# Patient Record
Sex: Male | Born: 1940 | Race: White | Hispanic: No | Marital: Married | Smoking: Never smoker
Health system: Southern US, Community
[De-identification: ages and names within clinical notes are randomized; demographics above are authoritative.]

## PROBLEM LIST (undated history)

## (undated) DIAGNOSIS — T7840XA Allergy, unspecified, initial encounter: Secondary | ICD-10-CM

## (undated) DIAGNOSIS — C801 Malignant (primary) neoplasm, unspecified: Secondary | ICD-10-CM

## (undated) DIAGNOSIS — C259 Malignant neoplasm of pancreas, unspecified: Secondary | ICD-10-CM

## (undated) HISTORY — DX: Malignant (primary) neoplasm, unspecified: C80.1

## (undated) HISTORY — DX: Allergy, unspecified, initial encounter: T78.40XA

---

## 1979-10-26 HISTORY — PX: APPENDECTOMY: SHX54

## 2009-10-25 HISTORY — PX: OTHER SURGICAL HISTORY: SHX169

## 2011-06-05 ENCOUNTER — Encounter: Payer: Self-pay | Admitting: Emergency Medicine

## 2011-06-05 ENCOUNTER — Emergency Department (HOSPITAL_BASED_OUTPATIENT_CLINIC_OR_DEPARTMENT_OTHER)
Admission: EM | Admit: 2011-06-05 | Discharge: 2011-06-05 | Disposition: A | Discharge status: Home / Self Care | Payer: Medicare Other | Attending: Emergency Medicine | Admitting: Emergency Medicine

## 2011-06-05 DIAGNOSIS — S61209A Unspecified open wound of unspecified finger without damage to nail, initial encounter: Secondary | ICD-10-CM | POA: Insufficient documentation

## 2011-06-05 DIAGNOSIS — Y92009 Unspecified place in unspecified non-institutional (private) residence as the place of occurrence of the external cause: Secondary | ICD-10-CM | POA: Insufficient documentation

## 2011-06-05 DIAGNOSIS — W298XXA Contact with other powered powered hand tools and household machinery, initial encounter: Secondary | ICD-10-CM | POA: Insufficient documentation

## 2011-06-05 DIAGNOSIS — S61219A Laceration without foreign body of unspecified finger without damage to nail, initial encounter: Secondary | ICD-10-CM

## 2011-06-05 MED ORDER — LIDOCAINE HCL 2 % IJ SOLN
INTRAMUSCULAR | Status: AC
  Administered 2011-06-05: 14:00:00 via SUBCUTANEOUS
  Filled 2011-06-05: qty 1

## 2011-06-05 MED ORDER — TETANUS-DIPHTH-ACELL PERTUSSIS 5-2.5-18.5 LF-MCG/0.5 IM SUSP: 0.5000 mL | Freq: Once | INTRAMUSCULAR | Status: DC

## 2011-06-05 NOTE — ED Provider Notes (Signed)
History     CSN: [id number] Arrival date & time: 06/05/2011  1:25 PM  Chief Complaint  Patient presents with  . Ear Laceration    Laceration to L index finger with chainsaw no loss of mobility noted bleeding controlled   Patient is a 70 y.o. male presenting with hand pain.  Hand Pain This is a new problem. The current episode started today. The problem occurs constantly. The problem has been unchanged. Associated symptoms include joint swelling. Pertinent negatives include no numbness or weakness. The symptoms are aggravated by bending. He has tried nothing for the symptoms. The treatment provided no relief.   patient reports he cut his left index finger with a chain saw.  Patient complains a laceration to the base of the finger. He denies any numbness. Patient denies any weakness. Patient's last tetanus shot was 8 years ago History reviewed. No pertinent past medical history.  History reviewed. No pertinent past surgical history.  Family History  Problem Relation Age of Onset  . Diabetes Mother   . Heart failure Father     History  Substance Use Topics  . Smoking status: Never Smoker   . Smokeless tobacco: Not on file  . Alcohol Use: No      Review of Systems  Musculoskeletal: Positive for joint swelling.  Skin: Positive for wound.  Neurological: Negative for weakness and numbness.  All other systems reviewed and are negative.    Physical Exam  BP 129/78  Pulse 88  Temp(Src) 99.8 F (37.7 C) (Oral)  Resp 22  SpO2 98%  Physical Exam  Nursing note and vitals reviewed. Constitutional: He appears well-developed and well-nourished.  HENT:  Head: Normocephalic.  Eyes: Pupils are equal, round, and reactive to light.  Cardiovascular: Normal rate.   Pulmonary/Chest: Effort normal.  Musculoskeletal: Normal range of motion.       1.8 cm laceration base of left second finger on the dorsal aspect. Patient has full range of motion. Neurovascular and neurosensory are  intact. I explored the wound. There is no tendon or vascular injury  Neurological: He is alert.  Skin: Skin is warm.  Psychiatric: He has a normal mood and affect.    ED Course  LACERATION REPAIR Date/Time: 06/05/2011 2:23 PM Performed by: Langston Masker Authorized by: Kennon Rounds D Consent: Verbal consent obtained. Risks and benefits: risks, benefits and alternatives were discussed Consent given by: patient Patient understanding: patient states understanding of the procedure being performed Patient identity confirmed: verbally with patient Time out: Immediately prior to procedure a "time out" was called to verify the correct patient, procedure, equipment, support staff and site/side marked as required. Body area: upper extremity Location details: left index finger Laceration length: 1.8 cm Contamination: The wound is contaminated. Foreign bodies: no foreign bodies Tendon involvement: none Nerve involvement: none Vascular damage: no Anesthesia: local infiltration Local anesthetic: lidocaine 2% without epinephrine Anesthetic total: 3 ml Patient sedated: no Preparation: Patient was prepped and draped in the usual sterile fashion. Irrigation solution: saline Amount of cleaning: standard Debridement: none Degree of undermining: none Skin closure: 5-0 Prolene Technique: simple Approximation: loose Approximation difficulty: simple Patient tolerance: Patient tolerated the procedure well with no immediate complications.    MDMPt counseled on wound care.      Langston Masker, [place] 06/05/11 1426  Langston Masker, [place] 06/05/11 1427  [place], [place] 06/05/11 1427

## 2011-06-05 NOTE — ED Notes (Signed)
Pt was ambulatory and voiced discharge instruction understanding.

## 2011-06-05 NOTE — ED Notes (Signed)
1 Inch laceration to L index finger

## 2011-06-07 NOTE — ED Provider Notes (Signed)
Evaluation and management procedures were performed by the PA/NP under my supervision/collaboration.    Felisa Bonier, MD 06/07/11 [phone]

## 2011-06-13 ENCOUNTER — Inpatient Hospital Stay (INDEPENDENT_AMBULATORY_CARE_PROVIDER_SITE_OTHER)
Admission: RE | Admit: 2011-06-13 | Discharge: 2011-06-13 | Disposition: A | Discharge status: Home / Self Care | Payer: Medicare Other | Source: Ambulatory Visit | Attending: Family Medicine | Admitting: Family Medicine

## 2011-06-13 ENCOUNTER — Encounter: Payer: Self-pay | Admitting: Family Medicine

## 2011-06-13 DIAGNOSIS — S61409A Unspecified open wound of unspecified hand, initial encounter: Secondary | ICD-10-CM

## 2011-06-13 DIAGNOSIS — Z23 Encounter for immunization: Secondary | ICD-10-CM

## 2011-09-11 ENCOUNTER — Emergency Department (INDEPENDENT_AMBULATORY_CARE_PROVIDER_SITE_OTHER)
Admission: EM | Admit: 2011-09-11 | Discharge: 2011-09-11 | Disposition: A | Discharge status: Home / Self Care | Payer: Medicare Other | Source: Home / Self Care | Attending: Emergency Medicine | Admitting: Emergency Medicine

## 2011-09-11 DIAGNOSIS — R05 Cough: Secondary | ICD-10-CM

## 2011-09-11 DIAGNOSIS — J069 Acute upper respiratory infection, unspecified: Secondary | ICD-10-CM

## 2011-09-11 MED ORDER — HYDROCODONE-HOMATROPINE 5-1.5 MG/5ML PO SYRP: 5.0000 mL | ORAL_SOLUTION | Freq: Four times a day (QID) | ORAL | Status: AC | PRN

## 2011-09-11 MED ORDER — AZITHROMYCIN 250 MG PO TABS: ORAL_TABLET | ORAL | Status: AC

## 2011-09-11 NOTE — ED Provider Notes (Signed)
History     CSN: [id number] Arrival date & time: 09/11/2011 10:59 AM   First MD Initiated Contact with Patient 09/11/11 1015      Chief Complaint  Patient presents with  . URI    (Consider location/radiation/quality/duration/timing/severity/associated sxs/prior treatment) HPI Jonathan Dawson is a 70 y.o. male who complains of onset of cold symptoms for 7 days.  he is using Mucinex-D and nebulizer which helps a little bit.  +sick contacts at home.  Not able to rest much recently + sore throat + cough No pleuritic pain No wheezing + nasal congestion + post-nasal drainage No sinus pain/pressure No chest congestion No itchy/red eyes No earache No hemoptysis No SOB No chills/sweats No fever No nausea No vomiting No abdominal pain No diarrhea No skin rashes No fatigue No myalgias No headache     Past Medical History  Diagnosis Date  . Asthma     Past Surgical History  Procedure Date  . Appendectomy     Family History  Problem Relation Age of Onset  . Diabetes Mother   . Heart failure Father     History  Substance Use Topics  . Smoking status: Current Everyday Smoker  . Smokeless tobacco: Not on file  . Alcohol Use: No      Review of Systems  Allergies  Review of patient's allergies indicates no known allergies.  Home Medications   Current Outpatient Rx  Name Route Sig Dispense Refill  . AZITHROMYCIN 250 MG PO TABS  Use as directed 1 each 0  . HYDROCODONE-HOMATROPINE 5-1.5 MG/5ML PO SYRP Oral Take 5 mLs by mouth every 6 (six) hours as needed for cough. 120 mL 0  . VARDENAFIL HCL 10 MG PO TABS Oral Take 10 mg by mouth daily as needed.        BP 144/85  Pulse 24  Temp(Src) 97.8 F (36.6 C) (Oral)  Resp 24  Ht 5\' 8"  (1.727 m)  Wt 192 lb 8 oz (87.317 kg)  BMI 29.27 kg/m2  SpO2 95%  Physical Exam  Nursing note and vitals reviewed. Constitutional: He is oriented to person, place, and time. He appears well-developed and well-nourished.  HENT:    Head: Normocephalic and atraumatic.  Right Ear: Tympanic membrane, external ear and ear canal normal.  Left Ear: Tympanic membrane, external ear and ear canal normal.  Nose: Mucosal edema and rhinorrhea present.  Mouth/Throat: Posterior oropharyngeal erythema present. No oropharyngeal exudate or posterior oropharyngeal edema.  Neck: Neck supple.  Cardiovascular: Regular rhythm and normal heart sounds.   Pulmonary/Chest: Effort normal and breath sounds normal. No respiratory distress.  Neurological: He is alert and oriented to person, place, and time.  Skin: Skin is warm and dry.  Psychiatric: He has a normal mood and affect. His speech is normal.    ED Course  Procedures (including critical care time)  Labs Reviewed - No data to display No results found.   1. Cough   2. Acute upper respiratory infections of unspecified site       MDM   1)  Take the prescribed antibiotic as instructed. 2)  Use nasal saline solution (over the counter) at least 3 times a day. 3)  Use over the counter decongestants like Mucinex-D every 12 hours as needed to help with congestion.  If you have hypertension, do not take medicines with sudafed.  4)  Can take tylenol every 6 hours or motrin every 8 hours for pain or fever. 5)  Follow up with your primary doctor if  no improvement in 5-7 days, sooner if increasing pain, fever, or new symptoms.       Lily Kocher, MD 09/11/11 [phone]

## 2011-09-27 NOTE — Progress Notes (Signed)
Summary: suture removal/TM   Vital Signs:  Patient Profile:   70 Years Old Male CC:      suture removal left 1st knuckle (day #8) Height:     68 inches Weight:      192 pounds O2 Sat:      94 % O2 treatment:    Room Air Temp:     98.2 degrees F oral Pulse rate:   75 / minute Resp:     16 per minute BP sitting:   147 / 89  (left arm) Cuff size:   regular  Pt. in pain?   no  Vitals Entered By: Lavell Islam RN (June 13, 2011 1:15 PM)                   Prior Medication List:  No prior medications documented  Updated Prior Medication List: No Medications Current Allergies: No known allergies History of Present Illness Chief Complaint: suture removal left 1st knuckle (day #8) History of Present Illness: 70 yo M here for suture removal.  Was using a chainsaw when it slipped and cut him on skin overlying radial aspect of 2nd MCP - no tendon damage or bony damage but required 4 sutures.  Reports he did not get a tetanus shot though has been > 7 years since last one.  Not on abx.  No fever, drainage, other complaints.  REVIEW OF SYSTEMS Constitutional Symptoms      Denies fever, chills, night sweats, weight loss, weight gain, and fatigue.  Eyes       Denies change in vision, eye pain, eye discharge, glasses, contact lenses, and eye surgery. Ear/Nose/Throat/Mouth       Denies hearing loss/aids, change in hearing, ear pain, ear discharge, dizziness, frequent runny nose, frequent nose bleeds, sinus problems, sore throat, hoarseness, and tooth pain or bleeding.  Respiratory       Denies dry cough, productive cough, wheezing, shortness of breath, asthma, bronchitis, and emphysema/COPD.  Cardiovascular       Denies murmurs, chest pain, and tires easily with exhertion.    Gastrointestinal       Denies stomach pain, nausea/vomiting, diarrhea, constipation, blood in bowel movements, and indigestion. Genitourniary       Denies painful urination, kidney stones, and loss of urinary  control. Neurological       Denies paralysis, seizures, and fainting/blackouts. Musculoskeletal       Denies muscle pain, joint pain, joint stiffness, decreased range of motion, redness, swelling, muscle weakness, and gout.  Skin       Denies bruising, unusual mles/lumps or sores, and hair/skin or nail changes.  Psych       Denies mood changes, temper/anger issues, anxiety/stress, speech problems, depression, and sleep problems. Other Comments: suture removal left 1st knuckle   Past History:  Family History: Last updated: 06/13/2011 Family History of CAD Male 1st degree relative <50 Family History Diabetes 1st degree relative  Social History: Last updated: 06/13/2011 married Never Smoked Alcohol use-yes Drug use-no  Past Medical History: Unremarkable  Past Surgical History: Appendectomy  Family History: Family History of CAD Male 1st degree relative <50 Family History Diabetes 1st degree relative  Social History: married Never Smoked Alcohol use-yes Drug use-no Smoking Status:  never Drug Use:  no Physical Exam General appearance: well developed, well nourished, no acute distress L hand - well healing wound to radial aspect overlying 2nd MCP joint.  No erythema, warmth, discharge.  4 sutures removed and 4 steri-strips placed.  DTaP given  today as well. Assessment New Problems: LACERATION, HAND, LEFT (ICD-882.0) FAMILY HISTORY DIABETES 1ST DEGREE RELATIVE (ICD-V18.0) FAMILY HISTORY OF CAD MALE 1ST DEGREE RELATIVE <50 (ICD-V17.3)   Plan New Orders: Tdap => 67yrs IM [90715] Admin 1st Vaccine [90471] No Charge Patient Arrived (NCPA0) [NCPA0] Planning Comments:   Sutures removed, steri-strips placed and discussed routine wound care.  DTaP given as well.  F/u as needed.   The patient and/or caregiver has been counseled thoroughly with regard to medications prescribed including dosage, schedule, interactions, rationale for use, and possible side effects and they  verbalize understanding.  Diagnoses and expected course of recovery discussed and will return if not improved as expected or if the condition worsens. Patient and/or caregiver verbalized understanding.   Orders Added: 1)  Tdap => 37yrs IM [90715] 2)  Admin 1st Vaccine [90471] 3)  No Charge Patient Arrived (NCPA0) [NCPA0]   Immunizations Administered:  Tetanus Vaccine:    Vaccine Type: Tdap    Site: left deltoid    Mfr: boostrix    Dose: 0.5 ml    Route: IM    Given by: Lavell Islam RN    Exp. Date: 09/17/2012    Lot #: [id number]    VIS given: 09/11/08 version given June 13, 2011.   Immunizations Administered:  Tetanus Vaccine:    Vaccine Type: Tdap    Site: left deltoid    Mfr: boostrix    Dose: 0.5 ml    Route: IM    Given by: Lavell Islam RN    Exp. Date: 09/17/2012    Lot #: [id number]    VIS given: 09/11/08 version given June 13, 2011.

## 2012-09-03 ENCOUNTER — Encounter: Payer: Self-pay | Admitting: Emergency Medicine

## 2012-09-03 ENCOUNTER — Emergency Department (INDEPENDENT_AMBULATORY_CARE_PROVIDER_SITE_OTHER)
Admission: EM | Admit: 2012-09-03 | Discharge: 2012-09-03 | Disposition: A | Discharge status: Home / Self Care | Payer: Medicare Other | Source: Home / Self Care | Attending: Emergency Medicine | Admitting: Emergency Medicine

## 2012-09-03 DIAGNOSIS — J069 Acute upper respiratory infection, unspecified: Secondary | ICD-10-CM | POA: Diagnosis not present

## 2012-09-03 DIAGNOSIS — J029 Acute pharyngitis, unspecified: Secondary | ICD-10-CM

## 2012-09-03 MED ORDER — GUAIFENESIN-CODEINE 100-10 MG/5ML PO SYRP: 5.0000 mL | ORAL_SOLUTION | Freq: Four times a day (QID) | ORAL | Status: DC | PRN

## 2012-09-03 MED ORDER — FLUTICASONE PROPIONATE 50 MCG/ACT NA SUSP: 2.0000 | Freq: Every day | NASAL | Status: DC

## 2012-09-03 MED ORDER — AZITHROMYCIN 250 MG PO TABS: ORAL_TABLET | ORAL | Status: DC

## 2012-09-03 NOTE — ED Notes (Signed)
Patient reports congestion, cough, body aches and fatigue with no documented fever x 6 days. May have taken OTC this a.m. (cannot remember). Has not had the Flu vaccination this season.

## 2012-09-03 NOTE — ED Provider Notes (Signed)
History     CSN: [id number]  Arrival date & time 09/03/12  1112   First MD Initiated Contact with Patient 09/03/12 1116      Chief Complaint  Patient presents with  . Nasal Congestion  . Generalized Body Aches  . Cough    (Consider location/radiation/quality/duration/timing/severity/associated sxs/prior treatment) HPI Jonathan Dawson is a 71 y.o. male who complains of onset of cold symptoms for 7 days.  The symptoms are constant and mild-moderate in severity.  He was here last year at the same time and admits to raking leaves last week. + sore throat + cough No pleuritic pain No wheezing + nasal congestion + post-nasal drainage + sinus pain/pressure No chest congestion No itchy/red eyes + earache (congestion) No hemoptysis No SOB + chills No fever No nausea No vomiting No abdominal pain No diarrhea No skin rashes No fatigue No myalgias No headache    Past Medical History  Diagnosis Date  . Asthma     Past Surgical History  Procedure Date  . Appendectomy     Family History  Problem Relation Age of Onset  . Diabetes Mother   . Heart failure Father     History  Substance Use Topics  . Smoking status: Former Games developer  . Smokeless tobacco: Not on file  . Alcohol Use: No      Review of Systems  All other systems reviewed and are negative.    Allergies  Review of patient's allergies indicates no known allergies.  Home Medications   Current Outpatient Rx  Name  Route  Sig  Dispense  Refill  . AZITHROMYCIN 250 MG PO TABS      Use as directed   1 each   0   . FLUTICASONE PROPIONATE 50 MCG/ACT NA SUSP   Nasal   Place 2 sprays into the nose daily.   16 g   2   . GUAIFENESIN-CODEINE 100-10 MG/5ML PO SYRP   Oral   Take 5 mLs by mouth 4 (four) times daily as needed for cough or congestion.   120 mL   0   . VARDENAFIL HCL 10 MG PO TABS   Oral   Take 10 mg by mouth daily as needed.             BP 114/85  Pulse 99  Temp 98.1 F (36.7 C)  (Oral)  Resp 16  Ht 5\' 8"  (1.727 m)  Wt 190 lb (86.183 kg)  BMI 28.89 kg/m2  SpO2 95%  Physical Exam  Nursing note and vitals reviewed. Constitutional: He is oriented to person, place, and time. He appears well-developed and well-nourished.  HENT:  Head: Normocephalic and atraumatic.  Right Ear: Tympanic membrane, external ear and ear canal normal.  Left Ear: Tympanic membrane, external ear and ear canal normal.  Nose: Mucosal edema and rhinorrhea present.  Mouth/Throat: Posterior oropharyngeal erythema present. No oropharyngeal exudate or posterior oropharyngeal edema.  Eyes: No scleral icterus.  Neck: Neck supple.  Cardiovascular: Regular rhythm and normal heart sounds.   Pulmonary/Chest: Effort normal and breath sounds normal. No respiratory distress.  Neurological: He is alert and oriented to person, place, and time.  Skin: Skin is warm and dry.  Psychiatric: He has a normal mood and affect. His speech is normal.    ED Course  Procedures (including critical care time)  Labs Reviewed - No data to display No results found.   1. Acute upper respiratory infections of unspecified site   2. Acute pharyngitis  MDM  1)  Take the prescribed antibiotic as instructed.  Could be viral/allergic and have also Rx Flonase which has helped him in the past. 2)  Use nasal saline solution (over the counter) at least 3 times a day. 3)  Use over the counter decongestants like Zyrtec-D every 12 hours as needed to help with congestion.  If you have hypertension, do not take medicines with sudafed.  4)  Can take tylenol every 6 hours or motrin every 8 hours for pain or fever. 5)  Follow up with your primary doctor if no improvement in 5-7 days, sooner if increasing pain, fever, or new symptoms.     Marlaine Hind, MD 09/03/12 1145

## 2012-09-05 ENCOUNTER — Telehealth: Payer: Self-pay | Admitting: *Deleted

## 2012-12-20 ENCOUNTER — Encounter: Payer: Self-pay | Admitting: Family Medicine

## 2012-12-20 ENCOUNTER — Ambulatory Visit (INDEPENDENT_AMBULATORY_CARE_PROVIDER_SITE_OTHER): Payer: Medicare Other | Admitting: Family Medicine

## 2012-12-20 VITALS — BP 137/88 | HR 81 | Temp 97.7°F | Ht 68.5 in | Wt 194.0 lb

## 2012-12-20 DIAGNOSIS — A499 Bacterial infection, unspecified: Secondary | ICD-10-CM

## 2012-12-20 DIAGNOSIS — L309 Dermatitis, unspecified: Secondary | ICD-10-CM | POA: Insufficient documentation

## 2012-12-20 DIAGNOSIS — I4510 Unspecified right bundle-branch block: Secondary | ICD-10-CM

## 2012-12-20 DIAGNOSIS — J329 Chronic sinusitis, unspecified: Secondary | ICD-10-CM | POA: Diagnosis not present

## 2012-12-20 DIAGNOSIS — Z87898 Personal history of other specified conditions: Secondary | ICD-10-CM

## 2012-12-20 DIAGNOSIS — N529 Male erectile dysfunction, unspecified: Secondary | ICD-10-CM | POA: Diagnosis not present

## 2012-12-20 DIAGNOSIS — L259 Unspecified contact dermatitis, unspecified cause: Secondary | ICD-10-CM | POA: Diagnosis not present

## 2012-12-20 DIAGNOSIS — J309 Allergic rhinitis, unspecified: Secondary | ICD-10-CM

## 2012-12-20 DIAGNOSIS — J302 Other seasonal allergic rhinitis: Secondary | ICD-10-CM | POA: Insufficient documentation

## 2012-12-20 DIAGNOSIS — B9689 Other specified bacterial agents as the cause of diseases classified elsewhere: Secondary | ICD-10-CM

## 2012-12-20 MED ORDER — DOXYCYCLINE HYCLATE 100 MG PO TABS: ORAL_TABLET | ORAL | Status: AC

## 2012-12-20 MED ORDER — TADALAFIL 20 MG PO TABS: 20.0000 mg | ORAL_TABLET | Freq: Every day | ORAL | Status: DC | PRN

## 2012-12-20 MED ORDER — TRIAMCINOLONE ACETONIDE 0.1 % EX CREA: TOPICAL_CREAM | Freq: Two times a day (BID) | CUTANEOUS | Status: DC

## 2012-12-20 NOTE — Progress Notes (Signed)
CC: Jonathan Dawson is a 72 y.o. male is here for Establish Care and Sinusitis   Subjective: HPI:  Patient presents to establish care, very pleasant 72 year old who also gets care at the [place].  Acute complaint of worsening seasonal allergies. He is noticed to 2 weeks of persistent worsening nasal congestion, darkening in color without blood. Associated with daily facial pressure in the left forehead worse with leaning forward. Has been associated with worsening productive cough. Symptoms are described as moderate in severity and worsening a daily basis. Interventions including daily fluticasone nasal spray and daily Zyrtec, nothing seems to make it better or worse. Symptoms are present during all waking hours and do not interfere with sleep. He complains of persistent clear nasal discharge mild severity throughout the winter for the past many years typically greatly improved with the above regimen.   Patient complains of erectile dysfunction has been present for years. He has tried Orthoptist and Viagra both of which caused headaches. He would like to try Cialis. He describes himself as someone with a grade libido but trouble maintaining an erection, no trouble initiating an erection. Denies dysuria, urinary hesitancy/retention, nor saddle pain. He denies exertional chest pain or limb claudication  Patient complains of a rash on his left shin that has been present for 2-3 months. It seems to be getting worse on a monthly basis. He denies rashes elsewhere. Interventions have included moisturizing cream and without much improvement. He has never had this before. Nothing particularly makes it better or worse.  He has a history of elevated PSA. Over the past 2 years it has fluctuated between 3.5 and 4.5. Most recent PSA spring of last year was below 4.0. He is currently seeing a urologist. He asks today my opinion whether or not he should get a biopsy. No family history or personal history of prostate cancer. He  has no genitourinary complaints other than trouble with erections.    Review of Systems - General ROS: negative for - chills, fever, night sweats, weight gain or weight loss Ophthalmic ROS: negative for - decreased vision Psychological ROS: negative for - anxiety or depression ENT ROS: negative for - hearing change, tinnitus Hematological and Lymphatic ROS: negative for - bleeding problems, bruising or swollen lymph nodes Breast ROS: negative Respiratory ROS: shortness of breath, or wheezing Cardiovascular ROS: no chest pain or dyspnea on exertion Gastrointestinal ROS: no abdominal pain, change in bowel habits, or black or bloody stools Genito-Urinary ROS: negative for - genital discharge, genital ulcers, incontinence or abnormal bleeding from genitals Musculoskeletal ROS: negative for - joint pain or muscle pain Neurological ROS: negative for - headaches or memory loss Dermatological ROS: negative for lumps, mole changes, rash and skin lesion changes other than that described above  Past Medical History  Diagnosis Date  . Asthma      Family History  Problem Relation Age of Onset  . Diabetes Mother   . Heart failure Father   . Stroke Father   . Alcoholism Father      History  Substance Use Topics  . Smoking status: Never Smoker   . Smokeless tobacco: Not on file  . Alcohol Use: No     Objective: Filed Vitals:   12/20/12 1043  BP: 137/88  Pulse: 81  Temp: 97.7 F (36.5 C)    General: Alert and Oriented, No Acute Distress HEENT: Pupils equal, round, reactive to light. Conjunctivae clear.  External ears unremarkable, canals clear with intact TMs with appropriate landmarks.  Middle ear  appears open without effusion. Pink inferior turbinates.  Moist mucous membranes, pharynx without inflammation nor lesions.  Neck supple without palpable lymphadenopathy nor abnormal masses. Left frontal sinus tenderness to percussion Lungs: Clear to auscultation bilaterally, no  wheezing/ronchi/rales.  Comfortable work of breathing. Good air movement. Cardiac: Regular rate and rhythm. Normal S1/S2.  No murmurs, rubs, nor gallops.   Abdomen: Normal bowel sounds, soft and non tender without palpable masses. Extremities: No peripheral edema.  Strong peripheral pulses.  Mental Status: No depression, anxiety, nor agitation. Skin: Warm and dry. Mild erythema  in patches with mild hyperkeratosis on the left shin.  Assessment & Plan: Jonathan Dawson was seen today for establish care and sinusitis.  Diagnoses and associated orders for this visit:  Eczema - triamcinolone cream (KENALOG) 0.1 %; Apply topically 2 (two) times daily.  Bacterial sinusitis - doxycycline (VIBRA-TABS) 100 MG tablet; One by mouth twice a day for ten days.  Erectile dysfunction - tadalafil (CIALIS) 20 MG tablet; Take 1 tablet (20 mg total) by mouth daily as needed for erectile dysfunction. Take 30-45 minutes prior to activity.  Right bundle branch block  Seasonal allergies  Other Orders - Albuterol Sulfate (VENTOLIN HFA IN); Inhale into the lungs.    Bacterial sinusitis: Due to penicillin allergy start doxycycline Eczema: Discussed diagnosis with patient encouraged to apply topical steroid to the left shin daily for the next 2 , return if no improvement or worsening Erectile dysfunction: Provided with sample prescription for Cialis History of elevated PSA: Discussed current guidelines suggesting PSA may be a poor screening tool for prostate cancer and that biopsies may cause more harm than good. He would like to follow PSAs annually and will continue this discussion with his urologist Seasonal allergies: Worsened do to bacterial infection, Continue Zyrtec and fluticasone, consider adding nasal saline washes to daily regimen EKG scanned into EMR showing right bundle branch block  Return in about 3 months (around 03/19/2013).

## 2012-12-26 ENCOUNTER — Encounter: Payer: Self-pay | Admitting: *Deleted

## 2013-03-30 DIAGNOSIS — R21 Rash and other nonspecific skin eruption: Secondary | ICD-10-CM | POA: Diagnosis not present

## 2013-03-30 DIAGNOSIS — D485 Neoplasm of uncertain behavior of skin: Secondary | ICD-10-CM | POA: Diagnosis not present

## 2013-03-30 DIAGNOSIS — B351 Tinea unguium: Secondary | ICD-10-CM | POA: Diagnosis not present

## 2013-03-30 DIAGNOSIS — C44721 Squamous cell carcinoma of skin of unspecified lower limb, including hip: Secondary | ICD-10-CM | POA: Diagnosis not present

## 2013-04-11 DIAGNOSIS — F429 Obsessive-compulsive disorder, unspecified: Secondary | ICD-10-CM | POA: Diagnosis not present

## 2013-04-16 DIAGNOSIS — C44721 Squamous cell carcinoma of skin of unspecified lower limb, including hip: Secondary | ICD-10-CM | POA: Diagnosis not present

## 2013-04-16 DIAGNOSIS — C44621 Squamous cell carcinoma of skin of unspecified upper limb, including shoulder: Secondary | ICD-10-CM | POA: Diagnosis not present

## 2013-05-07 ENCOUNTER — Encounter: Payer: Self-pay | Admitting: Sports Medicine

## 2013-05-07 ENCOUNTER — Ambulatory Visit (INDEPENDENT_AMBULATORY_CARE_PROVIDER_SITE_OTHER): Payer: Medicare Other

## 2013-05-07 ENCOUNTER — Ambulatory Visit (INDEPENDENT_AMBULATORY_CARE_PROVIDER_SITE_OTHER): Payer: Medicare Other | Admitting: Sports Medicine

## 2013-05-07 VITALS — BP 144/83 | HR 69 | Wt 192.0 lb

## 2013-05-07 DIAGNOSIS — S63509A Unspecified sprain of unspecified wrist, initial encounter: Secondary | ICD-10-CM

## 2013-05-07 DIAGNOSIS — S63502A Unspecified sprain of left wrist, initial encounter: Secondary | ICD-10-CM | POA: Insufficient documentation

## 2013-05-07 DIAGNOSIS — M5412 Radiculopathy, cervical region: Secondary | ICD-10-CM | POA: Insufficient documentation

## 2013-05-07 DIAGNOSIS — S59919A Unspecified injury of unspecified forearm, initial encounter: Secondary | ICD-10-CM

## 2013-05-07 DIAGNOSIS — S59909A Unspecified injury of unspecified elbow, initial encounter: Secondary | ICD-10-CM

## 2013-05-07 DIAGNOSIS — M542 Cervicalgia: Secondary | ICD-10-CM | POA: Diagnosis not present

## 2013-05-07 DIAGNOSIS — M47812 Spondylosis without myelopathy or radiculopathy, cervical region: Secondary | ICD-10-CM

## 2013-05-07 DIAGNOSIS — X500XXA Overexertion from strenuous movement or load, initial encounter: Secondary | ICD-10-CM

## 2013-05-07 MED ORDER — PREDNISONE 50 MG PO TABS
ORAL_TABLET | ORAL | Status: DC
Start: 2013-05-07 — End: 2013-06-05

## 2013-05-07 MED ORDER — MELOXICAM 15 MG PO TABS: ORAL_TABLET | ORAL | Status: DC

## 2013-05-07 NOTE — Assessment & Plan Note (Signed)
Velcro wrist brace. Mobic. Exercises. X-rays. Return in 2 weeks.

## 2013-05-07 NOTE — Assessment & Plan Note (Signed)
Occurred after overdoing it while lifting weights. This likely represents muscle spasm, I do suspect we will see some cervical degenerative disc disease. Prednisone, home exercises, x-rays. Return in 2 weeks for this.
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
## 2013-05-16 ENCOUNTER — Encounter: Payer: Medicare Other | Admitting: Family Medicine

## 2013-05-22 ENCOUNTER — Ambulatory Visit: Payer: Medicare Other | Admitting: Sports Medicine

## 2013-06-05 ENCOUNTER — Telehealth: Payer: Self-pay | Admitting: *Deleted

## 2013-06-05 ENCOUNTER — Ambulatory Visit (INDEPENDENT_AMBULATORY_CARE_PROVIDER_SITE_OTHER): Payer: Medicare Other | Admitting: Family Medicine

## 2013-06-05 ENCOUNTER — Encounter: Payer: Self-pay | Admitting: Family Medicine

## 2013-06-05 ENCOUNTER — Encounter: Payer: Self-pay | Admitting: *Deleted

## 2013-06-05 VITALS — BP 147/83 | HR 68 | Ht 68.5 in | Wt 193.0 lb

## 2013-06-05 DIAGNOSIS — Z131 Encounter for screening for diabetes mellitus: Secondary | ICD-10-CM

## 2013-06-05 DIAGNOSIS — Z0000 Encounter for general adult medical examination without abnormal findings: Secondary | ICD-10-CM | POA: Diagnosis not present

## 2013-06-05 DIAGNOSIS — Z23 Encounter for immunization: Secondary | ICD-10-CM

## 2013-06-05 DIAGNOSIS — N400 Enlarged prostate without lower urinary tract symptoms: Secondary | ICD-10-CM | POA: Insufficient documentation

## 2013-06-05 DIAGNOSIS — Z125 Encounter for screening for malignant neoplasm of prostate: Secondary | ICD-10-CM | POA: Diagnosis not present

## 2013-06-05 DIAGNOSIS — Z1211 Encounter for screening for malignant neoplasm of colon: Secondary | ICD-10-CM

## 2013-06-05 DIAGNOSIS — Z13220 Encounter for screening for lipoid disorders: Secondary | ICD-10-CM

## 2013-06-05 LAB — BASIC METABOLIC PANEL WITH GFR
BUN: 21 mg/dL (ref 6–23)
Chloride: 101 mEq/L (ref 96–112)
GFR, Est African American: >89 mL/min
Glucose, Bld: 100 mg/dL — ABNORMAL HIGH (ref 70–99)
Potassium: 4.5 mEq/L (ref 3.5–5.3)

## 2013-06-05 LAB — LIPID PANEL
Cholesterol: 143 mg/dL (ref 0–200)
Triglycerides: 64 mg/dL (ref <150)
VLDL: 13 mg/dL (ref 0–40)

## 2013-06-05 NOTE — Progress Notes (Signed)
Subjective:    Jonathan Dawson is a 72 y.o. male who presents for Medicare Annual/Subsequent preventive examination.   Preventive Screening-Counseling & Management  Tobacco History  Smoking status  . Never Smoker   Smokeless tobacco  . Not on file   Colonoscopy: He has never had this he is agreeable to referral to discuss risks and benefits Prostate: Discussed screening risks/beneifts with patient on 06/05/2013 he has a history of a PSA value of 5 a little over a year ago we will repeat this today  Influenza Vaccine: He will consider getting this in the fall Pneumovax: He is never had this he will receive today Td/Tdap: Td 05/2011 he is up-to-date Zoster: He has never had this would prefer to wait and think about it   Problems Prior to Visit 1. Eczema, ED, RBBB, elevated PSA  Current Problems (verified) Patient Active Problem List   Diagnosis Date Noted  . Enlarged prostate 06/05/2013  . Left wrist sprain 05/07/2013  . Cervical radiculitis 05/07/2013  . Eczema 12/20/2012  . Erectile dysfunction 12/20/2012  . Right bundle branch block 12/20/2012  . Seasonal allergies 12/20/2012  . History of elevated PSA 12/20/2012  . LACERATION, HAND, LEFT 06/13/2011    Medications Prior to Visit Current Outpatient Prescriptions on File Prior to Visit  Medication Sig Dispense Refill  . Albuterol Sulfate (VENTOLIN HFA IN) Inhale into the lungs.      . fluticasone (FLONASE) 50 MCG/ACT nasal spray Place 2 sprays into the nose daily.  16 g  2  . meloxicam (MOBIC) 15 MG tablet One tab PO qAM with breakfast for 2 weeks, then daily prn pain.  30 tablet  3  . tadalafil (CIALIS) 20 MG tablet Take 1 tablet (20 mg total) by mouth daily as needed for erectile dysfunction. Take 30-45 minutes prior to activity.  3 tablet  0  . triamcinolone cream (KENALOG) 0.1 % Apply topically 2 (two) times daily.  30 g  1   No current facility-administered medications on file prior to visit.    Current  Medications (verified) Current Outpatient Prescriptions  Medication Sig Dispense Refill  . Albuterol Sulfate (VENTOLIN HFA IN) Inhale into the lungs.      . fluticasone (FLONASE) 50 MCG/ACT nasal spray Place 2 sprays into the nose daily.  16 g  2  . meloxicam (MOBIC) 15 MG tablet One tab PO qAM with breakfast for 2 weeks, then daily prn pain.  30 tablet  3  . tadalafil (CIALIS) 20 MG tablet Take 1 tablet (20 mg total) by mouth daily as needed for erectile dysfunction. Take 30-45 minutes prior to activity.  3 tablet  0  . triamcinolone cream (KENALOG) 0.1 % Apply topically 2 (two) times daily.  30 g  1   No current facility-administered medications for this visit.     Allergies (verified) Penicillins   PAST HISTORY  Family History Family History  Problem Relation Age of Onset  . Diabetes Mother   . Heart failure Father   . Stroke Father   . Alcoholism Father     Social History History  Substance Use Topics  . Smoking status: Never Smoker   . Smokeless tobacco: Not on file  . Alcohol Use: No    Are there smokers in your home (other than you)?  No  Risk Factors Current exercise habits: Home exercise routine includes cycling and weight training.  Dietary issues discussed: Benefits of a varied diet with fruits and veggies and whole grains   Cardiac  risk factors: advanced age (older than 85 for men, 46 for women) and obesity (BMI >= 30 kg/m2).  Depression Screen (Note: if answer to either of the following is "Yes", a more complete depression screening is indicated)   Q1: Over the past two weeks, have you felt down, depressed or hopeless? No  Q2: Over the past two weeks, have you felt little interest or pleasure in doing things? No  Have you lost interest or pleasure in daily life? No  Do you often feel hopeless? No  Do you cry easily over simple problems? No  Activities of Daily Living In your present state of health, do you have any difficulty performing the following  activities?:  Driving? No Managing money?  No Feeding yourself? No Getting from bed to chair? No Climbing a flight of stairs? No Preparing food and eating?: No Bathing or showering? No Getting dressed: No Getting to the toilet? No Using the toilet:No Moving around from place to place: No In the past year have you fallen or had a near fall?:No   Are you sexually active?  Yes  Do you have more than one partner?  No  Hearing Difficulties: No Do you often ask people to speak up or repeat themselves? No Do you experience ringing or noises in your ears? No Do you have difficulty understanding soft or whispered voices? No   Do you feel that you have a problem with memory? No  Do you often misplace items? No  Do you feel safe at home?  No  Cognitive Testing  Alert? Yes  Normal Appearance?Yes  Oriented to person? Yes  Place? Yes   Time? Yes  Recall of three objects?  Yes  Can perform simple calculations? Yes  Displays appropriate judgment?Yes  Can read the correct time from a watch face?Yes   Advanced Directives have been discussed with the patient? Yes   List the Names of Other Physician/Practitioners you currently use: 1.    Indicate any recent Medical Services you may have received from other than [facility] providers in the past year (date may be approximate).  Immunization History  Administered Date(s) Administered  . Pneumococcal Polysaccharide 06/05/2013  . Td 06/13/2011    Screening Tests Health Maintenance  Topic Date Due  . Colonoscopy  05/12/1990  . Zostavax  05/12/2000  . Pneumococcal Polysaccharide Vaccine Age 66 And Over  05/12/2005  . Influenza Vaccine  06/25/2013  . Tetanus/tdap  06/12/2021    All answers were reviewed with the patient and necessary referrals were made:  Laren Boom, DO   06/05/2013   History reviewed: allergies, current medications, past family history, past medical history, past social history, past surgical history and problem  list  Review of Systems Review of Systems - General ROS: negative for - chills, fever, night sweats, weight gain or weight loss Ophthalmic ROS: negative for - decreased vision Psychological ROS: negative for - anxiety or depression ENT ROS: negative for - hearing change, nasal congestion, tinnitus or allergies Hematological and Lymphatic ROS: negative for - bleeding problems, bruising or swollen lymph nodes Breast ROS: negative Respiratory ROS: no cough, shortness of breath, or wheezing Cardiovascular ROS: no chest pain or dyspnea on exertion Gastrointestinal ROS: no abdominal pain, change in bowel habits, or black or bloody stools Genito-Urinary ROS: negative for - genital discharge, genital ulcers, incontinence or abnormal bleeding from genitals Musculoskeletal ROS: negative for - joint pain or muscle pain Neurological ROS: negative for - headaches or memory loss Dermatological ROS: negative for  lumps, mole changes, rash and skin lesion changes  Objective:     Vision by Snellen chart: right eye: 20/20, left eye 20/25, both eyes 20/20 Blood pressure 147/83, pulse 68, height 5' 8.5" (1.74 m), weight 193 lb (87.544 kg). Body mass index is 28.92 kg/(m^2).  General: No Acute Distress HEENT: Atraumatic, normocephalic, conjunctivae normal without scleral icterus.  No nasal discharge, hearing grossly intact, TMs with good landmarks bilaterally with no middle ear abnormalities, posterior pharynx clear without oral lesions. Neck: Supple, trachea midline, no cervical nor supraclavicular adenopathy. Pulmonary: Clear to auscultation bilaterally without wheezing, rhonchi, nor rales. Cardiac: Regular rate and rhythm.  No murmurs, rubs, nor gallops. No peripheral edema.  2+ peripheral pulses bilaterally. Abdomen: Bowel sounds normal.  No masses.  Non-tender without rebound.  Negative Murphy's sign. XB:JYNWG without lesions.  Bilateral descended non-tender testicles without palpable abnormal masses.  Normal rectal tone he has a moderately enlarged nontender smooth prostate MSK: Grossly intact, no signs of weakness.  Full strength throughout upper and lower extremities.  Full ROM in upper and lower extremities.  No midline spinal tenderness. Neuro: Gait unremarkable, CN II-XII grossly intact.  C5-C6 Reflex 2/4 Bilaterally, L4 Reflex 2/4 Bilaterally.  Cerebellar function intact. Skin: No rashes. Psych: Alert and oriented to person/place/time.  Thought process normal. No anxiety/depression.     Assessment:     Immunization deficiency, enlarged prostate history of elevated PSA otherwise normal exam     Plan:     During the course of the visit the patient was educated and counseled about appropriate screening and preventive services including:    Pneumococcal vaccine   Screening electrocardiogram  Prostate cancer screening  Colorectal cancer screening  Diabetes screening  Diet review for nutrition referral? Not indicated   Patient Instructions (the written plan) was given to the patient.  Medicare Attestation I have personally reviewed: The patient's medical and social history Their use of alcohol, tobacco or illicit drugs Their current medications and supplements The patient's functional ability including ADLs,fall risks, home safety risks, cognitive, and hearing and visual impairment Diet and physical activities Evidence for depression or mood disorders  The patient's weight, height, BMI, and visual acuity have been recorded in the chart.  I have made referrals, counseling, and provided education to the patient based on review of the above and I have provided the patient with a written personalized care plan for preventive services.     Laren Boom, DO   06/05/2013

## 2013-06-05 NOTE — Telephone Encounter (Signed)
Message copied by Wyline Beady on Tue Jun 05, 2013  4:00 PM ------      Message from: Laren Boom      Created: Tue Jun 05, 2013 12:51 PM      Regarding: EKG       Sue Lush,      Will you please let mr. Banko know that his EKG today was unchanged from that obtained in 2006, this is good news. ------

## 2013-06-05 NOTE — Telephone Encounter (Signed)
Left message on vm

## 2013-06-06 ENCOUNTER — Encounter: Payer: Self-pay | Admitting: Family Medicine

## 2013-06-20 DIAGNOSIS — C44621 Squamous cell carcinoma of skin of unspecified upper limb, including shoulder: Secondary | ICD-10-CM | POA: Diagnosis not present

## 2013-06-20 DIAGNOSIS — Z85828 Personal history of other malignant neoplasm of skin: Secondary | ICD-10-CM | POA: Diagnosis not present

## 2013-07-19 ENCOUNTER — Encounter: Payer: Self-pay | Admitting: Gastroenterology

## 2013-07-19 DIAGNOSIS — M62838 Other muscle spasm: Secondary | ICD-10-CM | POA: Diagnosis not present

## 2013-07-19 DIAGNOSIS — M531 Cervicobrachial syndrome: Secondary | ICD-10-CM | POA: Diagnosis not present

## 2013-07-19 DIAGNOSIS — M9981 Other biomechanical lesions of cervical region: Secondary | ICD-10-CM | POA: Diagnosis not present

## 2013-07-20 ENCOUNTER — Encounter: Payer: Self-pay | Admitting: Family Medicine

## 2013-07-24 DIAGNOSIS — M9981 Other biomechanical lesions of cervical region: Secondary | ICD-10-CM | POA: Diagnosis not present

## 2013-07-24 DIAGNOSIS — M62838 Other muscle spasm: Secondary | ICD-10-CM | POA: Diagnosis not present

## 2013-07-24 DIAGNOSIS — M531 Cervicobrachial syndrome: Secondary | ICD-10-CM | POA: Diagnosis not present

## 2013-07-31 DIAGNOSIS — M62838 Other muscle spasm: Secondary | ICD-10-CM | POA: Diagnosis not present

## 2013-07-31 DIAGNOSIS — M531 Cervicobrachial syndrome: Secondary | ICD-10-CM | POA: Diagnosis not present

## 2013-07-31 DIAGNOSIS — M9981 Other biomechanical lesions of cervical region: Secondary | ICD-10-CM | POA: Diagnosis not present

## 2013-08-10 ENCOUNTER — Encounter: Payer: Self-pay | Admitting: Family Medicine

## 2013-09-03 ENCOUNTER — Other Ambulatory Visit: Payer: Self-pay | Admitting: Family Medicine

## 2013-09-06 ENCOUNTER — Encounter: Payer: Self-pay | Admitting: Internal Medicine

## 2013-09-07 ENCOUNTER — Telehealth: Payer: Self-pay | Admitting: *Deleted

## 2013-09-07 NOTE — Telephone Encounter (Signed)
Pt called wanting a refill on a cough medication and albuterol that rx'ed by Dr. Orson Aloe lasty year. I called pt and advised him that he would need an appt. Pt declined stating his sxs were starting to get better

## 2013-09-17 ENCOUNTER — Encounter: Payer: Self-pay | Admitting: Family Medicine

## 2013-09-17 ENCOUNTER — Ambulatory Visit (INDEPENDENT_AMBULATORY_CARE_PROVIDER_SITE_OTHER): Payer: Medicare Other | Admitting: Family Medicine

## 2013-09-17 VITALS — BP 122/78 | HR 95 | Temp 98.0°F | Wt 196.0 lb

## 2013-09-17 DIAGNOSIS — J209 Acute bronchitis, unspecified: Secondary | ICD-10-CM

## 2013-09-17 DIAGNOSIS — N529 Male erectile dysfunction, unspecified: Secondary | ICD-10-CM

## 2013-09-17 MED ORDER — AZITHROMYCIN 250 MG PO TABS: ORAL_TABLET | ORAL | Status: AC

## 2013-09-17 MED ORDER — GUAIFENESIN-CODEINE 100-10 MG/5ML PO SOLN: 10.0000 mL | Freq: Four times a day (QID) | ORAL | Status: DC | PRN

## 2013-09-17 MED ORDER — ALBUTEROL SULFATE HFA 108 (90 BASE) MCG/ACT IN AERS: INHALATION_SPRAY | RESPIRATORY_TRACT | Status: AC

## 2013-09-17 NOTE — Progress Notes (Signed)
CC: Jonathan Dawson is a 72 y.o. male is here for congestion and cough   Subjective: HPI:  3 weeks of worsening productive cough present all hours of the day worse in the evening moderate in severity. Cough is described only as productive, without blood and sputum. Denies fevers, chills, nausea, abdominal pain, chest pain. Review of systems positive for mild shortness of breath  Patient requesting guidance on erectile dysfunction he is currently taking Levitra 10 mg before sexual activity. He has no difficulty with maintaining or initiating erection only provided he takes this medication. His only complaint is feeling hung over the morning after taking this medication. Symptoms are worse with Viagra and Cialis. Denies chest pain, headache, nor motor or sensory disturbances   Review Of Systems Outlined In HPI  Past Medical History  Diagnosis Date  . Asthma      Family History  Problem Relation Age of Onset  . Diabetes Mother   . Heart failure Father   . Stroke Father   . Alcoholism Father      History  Substance Use Topics  . Smoking status: Never Smoker   . Smokeless tobacco: Not on file  . Alcohol Use: No     Objective: Filed Vitals:   09/17/13 1522  BP: 122/78  Pulse: 95  Temp: 98 F (36.7 C)    General: Alert and Oriented, No Acute Distress HEENT: Pupils equal, round, reactive to light. Conjunctivae clear.  External ears unremarkable, canals clear with intact TMs with appropriate landmarks.  Middle ear appears open without effusion. Pink inferior turbinates.  Moist mucous membranes, pharynx without inflammation nor lesions.  Neck supple without palpable lymphadenopathy nor abnormal masses. Lungs: Comfortable work of breathing with mild central rhonchi without wheezing nor rales nor signs of consolidation Cardiac: Regular rate and rhythm. Normal S1/S2.  No murmurs, rubs, nor gallops.   Extremities: No peripheral edema.  Strong peripheral pulses.  Mental Status: No  depression, anxiety, nor agitation. Skin: Warm and dry.  Assessment & Plan: Jonathan Dawson was seen today for congestion and cough.  Diagnoses and associated orders for this visit:  Acute exacerbation of chronic bronchitis - albuterol (PROVENTIL HFA;VENTOLIN HFA) 108 (90 BASE) MCG/ACT inhaler; Inhale two puffs every 4-6 hours only as needed for shortness of breath or wheezing. - azithromycin (ZITHROMAX) 250 MG tablet; Take two tabs at once on day 1, then one tab daily on days 2-5. - guaiFENesin-codeine 100-10 MG/5ML syrup; Take 10 mLs by mouth every 6 (six) hours as needed for cough.  Erectile dysfunction    It appears he has bacterial bronchitis issues 1-2 times a year.  There is a high suspicion for chronic bronchitis therefore treating as exacerbation with albuterol, azithromycin and as needed guaifenesin/codeine. When he feels like he is in his normal state of health I could return for spirometry Erectile dysfunction: Chronic controlled condition, encouraged him to cut back on the dose of Levitra in hopes of avoiding side effects  Return in about 3 months (around 12/18/2013).

## 2013-10-02 DIAGNOSIS — I447 Left bundle-branch block, unspecified: Secondary | ICD-10-CM | POA: Diagnosis not present

## 2013-10-02 DIAGNOSIS — R404 Transient alteration of awareness: Secondary | ICD-10-CM | POA: Diagnosis not present

## 2013-10-02 DIAGNOSIS — D582 Other hemoglobinopathies: Secondary | ICD-10-CM | POA: Diagnosis not present

## 2013-10-02 DIAGNOSIS — R55 Syncope and collapse: Secondary | ICD-10-CM | POA: Diagnosis not present

## 2013-10-02 DIAGNOSIS — S0990XA Unspecified injury of head, initial encounter: Secondary | ICD-10-CM | POA: Diagnosis not present

## 2013-10-02 DIAGNOSIS — J301 Allergic rhinitis due to pollen: Secondary | ICD-10-CM | POA: Diagnosis present

## 2013-10-02 DIAGNOSIS — IMO0002 Reserved for concepts with insufficient information to code with codable children: Secondary | ICD-10-CM | POA: Diagnosis not present

## 2013-10-02 DIAGNOSIS — I519 Heart disease, unspecified: Secondary | ICD-10-CM | POA: Diagnosis not present

## 2013-10-02 DIAGNOSIS — I359 Nonrheumatic aortic valve disorder, unspecified: Secondary | ICD-10-CM | POA: Diagnosis not present

## 2013-10-02 DIAGNOSIS — I4510 Unspecified right bundle-branch block: Secondary | ICD-10-CM | POA: Diagnosis not present

## 2013-10-02 DIAGNOSIS — R030 Elevated blood-pressure reading, without diagnosis of hypertension: Secondary | ICD-10-CM | POA: Diagnosis not present

## 2013-10-04 DIAGNOSIS — R55 Syncope and collapse: Secondary | ICD-10-CM | POA: Diagnosis not present

## 2013-10-05 ENCOUNTER — Encounter: Payer: Self-pay | Admitting: Family Medicine

## 2013-10-08 DIAGNOSIS — I4510 Unspecified right bundle-branch block: Secondary | ICD-10-CM | POA: Diagnosis not present

## 2013-10-08 DIAGNOSIS — R55 Syncope and collapse: Secondary | ICD-10-CM | POA: Diagnosis not present

## 2013-10-09 ENCOUNTER — Encounter: Payer: Self-pay | Admitting: Family Medicine

## 2013-10-30 ENCOUNTER — Ambulatory Visit (INDEPENDENT_AMBULATORY_CARE_PROVIDER_SITE_OTHER): Payer: Medicare Other | Admitting: Sports Medicine

## 2013-10-30 ENCOUNTER — Encounter: Payer: Self-pay | Admitting: Sports Medicine

## 2013-10-30 ENCOUNTER — Ambulatory Visit (INDEPENDENT_AMBULATORY_CARE_PROVIDER_SITE_OTHER): Payer: Medicare Other

## 2013-10-30 VITALS — BP 140/85 | HR 73 | Wt 193.0 lb

## 2013-10-30 DIAGNOSIS — M25839 Other specified joint disorders, unspecified wrist: Secondary | ICD-10-CM

## 2013-10-30 DIAGNOSIS — M25531 Pain in right wrist: Secondary | ICD-10-CM

## 2013-10-30 DIAGNOSIS — M25539 Pain in unspecified wrist: Secondary | ICD-10-CM

## 2013-10-30 DIAGNOSIS — M7989 Other specified soft tissue disorders: Secondary | ICD-10-CM | POA: Diagnosis not present

## 2013-10-30 NOTE — Assessment & Plan Note (Signed)
There is pain and swelling localized over the dorsal radial carpal joint. It is not over any bony prominence and I do think it is simply sprained wrist. X-rays, NSAIDs which he already has at home, Velcro wrist brace. Return to see me in 2 weeks.

## 2013-10-30 NOTE — Progress Notes (Signed)
   Subjective:    I'm seeing this patient as a consultation for:  Dr. Ileene Rubens    CC: Right wrist pain  HPI: This is a pleasant 73 year old male, recently he passed out and fell impacting his right wrist. He had immediate pain, swelling the localized over the dorsum of the radiocarpal joint, without radiation, moderate, persistent.  Past medical history, Surgical history, Family history not pertinant except as noted below, Social history, Allergies, and medications have been entered into the medical record, reviewed, and no changes needed.   Review of Systems: No headache, visual changes, nausea, vomiting, diarrhea, constipation, dizziness, abdominal pain, skin rash, fevers, chills, night sweats, weight loss, swollen lymph nodes, body aches, joint swelling, muscle aches, chest pain, shortness of breath, mood changes, visual or auditory hallucinations.   Objective:   General: Well Developed, well nourished, and in no acute distress.  Neuro/Psych: Alert and oriented x3, extra-ocular muscles intact, able to move all 4 extremities, sensation grossly intact. Skin: Warm and dry, no rashes noted.  Respiratory: Not using accessory muscles, speaking in full sentences, trachea midline.  Cardiovascular: Pulses palpable, no extremity edema. Abdomen: Does not appear distended. Right Wrist: Mild visible swelling. ROM smooth and normal with good flexion and extension and ulnar/radial deviation that is symmetrical with opposite wrist. Tender to palpation over the radiocarpal joint. No snuffbox tenderness. No tenderness over Canal of Guyon. Strength 5/5 in all directions without pain. Negative Finkelstein, tinel's and phalens. Negative Watson's test.  X-rays were reviewed and do show some calcification dorsally over the wrist joint, otherwise no signs of any new fractures.  Impression and Recommendations:   This case required medical decision making of moderate complexity.

## 2013-11-14 ENCOUNTER — Encounter: Payer: Medicare Other | Admitting: Internal Medicine

## 2013-11-30 ENCOUNTER — Other Ambulatory Visit: Payer: Self-pay | Admitting: Family Medicine

## 2013-12-15 ENCOUNTER — Other Ambulatory Visit: Payer: Self-pay | Admitting: Family Medicine

## 2013-12-17 ENCOUNTER — Telehealth: Payer: Self-pay | Admitting: *Deleted

## 2013-12-17 DIAGNOSIS — N529 Male erectile dysfunction, unspecified: Secondary | ICD-10-CM

## 2013-12-17 MED ORDER — VARDENAFIL HCL 20 MG PO TABS: ORAL_TABLET | ORAL | Status: DC

## 2013-12-17 NOTE — Telephone Encounter (Signed)
Pt is requesting a refill on Levitra. Pt is due for a f/u and doesn't have an appt on file. Needs f/u and will send 1 month. Pt notified that he needs a refill

## 2013-12-17 NOTE — Telephone Encounter (Signed)
[facility] called and states the directions on the Levitra rx say for the pt to take 3/4 to 1/2 tablet as needed. They want to know if they can change it to say take 1/2 to 1 tablet as needed.

## 2013-12-18 NOTE — Telephone Encounter (Signed)
Spoke with someone at the pharm and ask them to leave the directions the same

## 2013-12-18 NOTE — Telephone Encounter (Signed)
Seth Bake, Will you please let [facility] know that our records show that we've never sent an Rx with those directions.  I'd prefer that the directions state 5-10mg  PO PRN as was originally sent yesterday.

## 2013-12-24 LAB — POCT RAPID STREP A (OFFICE): RAPID STREP A SCREEN: NEGATIVE

## 2013-12-26 ENCOUNTER — Emergency Department (INDEPENDENT_AMBULATORY_CARE_PROVIDER_SITE_OTHER)
Admission: EM | Admit: 2013-12-26 | Discharge: 2013-12-26 | Disposition: A | Discharge status: Home / Self Care | Payer: Medicare Other | Source: Home / Self Care | Attending: Family Medicine | Admitting: Family Medicine

## 2013-12-26 ENCOUNTER — Encounter: Payer: Self-pay | Admitting: Emergency Medicine

## 2013-12-26 DIAGNOSIS — J029 Acute pharyngitis, unspecified: Secondary | ICD-10-CM | POA: Diagnosis not present

## 2013-12-26 NOTE — ED Notes (Signed)
Pt c/o sore throat and fatigue x 6 days. Denies fever.

## 2013-12-26 NOTE — Discharge Instructions (Signed)
Monitor symptoms for now.  Continue Flonase and Zyrtec.

## 2013-12-26 NOTE — ED Provider Notes (Signed)
CSN: [id number]     Arrival date & time 12/26/13  7628 History   First MD Initiated Contact with Patient 12/26/13 1853     Chief Complaint  Patient presents with  . Sore Throat  . Fatigue      HPI Comments: Patient has had a "scratchy" throat for 6 days, mild fatigue, and headache.  He feels well otherwise.  He is preparing to go out of town for the weekend, and wants to make sure he does not have a strep throat.  The history is provided by the patient.    Past Medical History  Diagnosis Date  . Asthma    Past Surgical History  Procedure Laterality Date  . Appendectomy  1981  . Left hand surgery  2011   Family History  Problem Relation Age of Onset  . Diabetes Mother   . Heart failure Father   . Stroke Father   . Alcoholism Father    History  Substance Use Topics  . Smoking status: Never Smoker   . Smokeless tobacco: Not on file  . Alcohol Use: No    Review of Systems + sore throat, minimal No cough No pleuritic pain No wheezing No nasal congestion No post-nasal drainage No sinus pain/pressure No itchy/red eyes No earache No hemoptysis No SOB No fever/chills No nausea No vomiting No abdominal pain No diarrhea No urinary symptoms No skin rash + fatigue No myalgias + mild headache Used OTC meds without relief  Allergies  Penicillins  Home Medications   Current Outpatient Rx  Name  Route  Sig  Dispense  Refill  . albuterol (PROVENTIL HFA;VENTOLIN HFA) 108 (90 BASE) MCG/ACT inhaler      Inhale two puffs every 4-6 hours only as needed for shortness of breath or wheezing.   1 Inhaler   1   . CIALIS 20 MG tablet      TAKE ONE TABLET BY MOUTH ONCE DAILY AS NEEDED FOR ERECTILE DYSFUNCTION. TAKE 30 TO 45 MINUTES PRIOR TO ACTIVITY   3 tablet   0   . fluticasone (FLONASE) 50 MCG/ACT nasal spray   Nasal   Place 2 sprays into the nose daily.   16 g   2   . triamcinolone cream (KENALOG) 0.1 %   Topical   Apply topically 2 (two) times daily.   30  g   1   . vardenafil (LEVITRA) 20 MG tablet      5-10mg  PO PRN   20 tablet   0     Pt needs a f/u before future refills    BP 136/93  Pulse 95  Temp(Src) 98.5 F (36.9 C) (Oral)  Resp 18  Ht 5\' 8"  (1.727 m)  Wt 196 lb (88.905 kg)  BMI 29.81 kg/m2  SpO2 96% Physical Exam Nursing notes and Vital Signs reviewed. Appearance:  Patient appears healthy, stated age, and in no acute distress Eyes:  Pupils are equal, round, and reactive to light and accomodation.  Extraocular movement is intact.  Conjunctivae are not inflamed  Ears:  Canals normal.  Tympanic membranes normal.  Nose:  Mildly congested turbinates.  No sinus tenderness.    Pharynx:  Normal Neck:  Supple.  No adenopathy Lungs:  Clear to auscultation.  Breath sounds are equal.  Heart:  Regular rate and rhythm without murmurs, rubs, or gallops.  Abdomen:  Nontender without masses or hepatosplenomegaly.  Bowel sounds are present.  No CVA or flank tenderness.  Extremities:  No edema.  No calf  tenderness Skin:  No rash present.   ED Course  Procedures  None  Labs Review:  POCT Rapid strep test negative.       MDM   1. Acute pharyngitis; suspect viral etiology    There is no evidence of bacterial infection today.  Monitor symptoms for now.  Continue Flonase and Zyrtec.    Kandra Nicolas, MD 12/27/13 [phone]

## 2014-01-01 ENCOUNTER — Encounter: Payer: Self-pay | Admitting: Internal Medicine

## 2014-01-14 DIAGNOSIS — L570 Actinic keratosis: Secondary | ICD-10-CM | POA: Diagnosis not present

## 2014-01-14 DIAGNOSIS — M79609 Pain in unspecified limb: Secondary | ICD-10-CM | POA: Diagnosis not present

## 2014-01-14 DIAGNOSIS — L280 Lichen simplex chronicus: Secondary | ICD-10-CM | POA: Diagnosis not present

## 2014-01-14 DIAGNOSIS — C44721 Squamous cell carcinoma of skin of unspecified lower limb, including hip: Secondary | ICD-10-CM | POA: Diagnosis not present

## 2014-01-14 DIAGNOSIS — Z85828 Personal history of other malignant neoplasm of skin: Secondary | ICD-10-CM | POA: Diagnosis not present

## 2014-01-25 ENCOUNTER — Encounter: Payer: Self-pay | Admitting: Emergency Medicine

## 2014-01-25 ENCOUNTER — Emergency Department (INDEPENDENT_AMBULATORY_CARE_PROVIDER_SITE_OTHER)
Admission: EM | Admit: 2014-01-25 | Discharge: 2014-01-25 | Disposition: A | Discharge status: Home / Self Care | Payer: Medicare Other | Source: Home / Self Care | Attending: Family Medicine | Admitting: Family Medicine

## 2014-01-25 DIAGNOSIS — H8110 Benign paroxysmal vertigo, unspecified ear: Secondary | ICD-10-CM | POA: Diagnosis not present

## 2014-01-25 MED ORDER — MECLIZINE HCL 12.5 MG PO TABS: ORAL_TABLET | ORAL | Status: DC

## 2014-01-25 NOTE — Discharge Instructions (Signed)

## 2014-01-25 NOTE — ED Provider Notes (Signed)
CSN: [id number]     Arrival date & time 01/25/14  1433 History   First MD Initiated Contact with Patient 01/25/14 1507     Chief Complaint  Patient presents with  . Dizziness      HPI Comments: Patient states that he awoke two days ago feeling dizzy and off-balance without other symptoms.  At 4am today he awoke with vertigo, nausea, and vomiting, now resolved.  He feels well otherwise. He states that he has always had a tendency to become dizzy when supine. He states that on September 24, 2013 he had an episode of syncope and was admitted to hospital for evaluation.  His workup included head CT, echocardiogram, EKG, cardiac stress test, and cardiac event monitor.  All results were normal.  Patient is a 73 y.o. male presenting with dizziness. The history is provided by the patient.  Dizziness Quality:  Imbalance and room spinning Severity:  Mild Onset quality:  Sudden Duration:  1 day Timing:  Intermittent Progression:  Resolved Chronicity:  Recurrent Context: head movement   Context comment:  Supine Relieved by:  Nothing Worsened by:  Nothing tried Associated symptoms: nausea and vomiting   Associated symptoms: no chest pain, no headaches, no hearing loss, no palpitations, no shortness of breath, no syncope, no tinnitus, no vision changes and no weakness     Past Medical History  Diagnosis Date  . Asthma    Past Surgical History  Procedure Laterality Date  . Appendectomy  1981  . Left hand surgery  2011   Family History  Problem Relation Age of Onset  . Diabetes Mother   . Heart failure Father   . Stroke Father   . Alcoholism Father    History  Substance Use Topics  . Smoking status: Never Smoker   . Smokeless tobacco: Not on file  . Alcohol Use: Yes    Review of Systems  HENT: Negative for hearing loss and tinnitus.   Respiratory: Negative for shortness of breath.   Cardiovascular: Negative for chest pain, palpitations and syncope.  Gastrointestinal: Positive for  nausea and vomiting.  Neurological: Positive for dizziness. Negative for headaches.  All other systems reviewed and are negative.    Allergies  Penicillins  Home Medications   Current Outpatient Rx  Name  Route  Sig  Dispense  Refill  . albuterol (PROVENTIL HFA;VENTOLIN HFA) 108 (90 BASE) MCG/ACT inhaler      Inhale two puffs every 4-6 hours only as needed for shortness of breath or wheezing.   1 Inhaler   1   . CIALIS 20 MG tablet      TAKE ONE TABLET BY MOUTH ONCE DAILY AS NEEDED FOR ERECTILE DYSFUNCTION. TAKE 30 TO 45 MINUTES PRIOR TO ACTIVITY   3 tablet   0   . fluticasone (FLONASE) 50 MCG/ACT nasal spray   Nasal   Place 2 sprays into the nose daily.   16 g   2   . meclizine (ANTIVERT) 12.5 MG tablet      Take one tab by mouth 2 or 3 times daily as needed for dizziness   15 tablet   0   . triamcinolone cream (KENALOG) 0.1 %   Topical   Apply topically 2 (two) times daily.   30 g   1   . vardenafil (LEVITRA) 20 MG tablet      5-10mg  PO PRN   20 tablet   0     Pt needs a f/u before future refills  BP 125/82  Pulse 92  Temp(Src) 98.2 F (36.8 C) (Oral)  Ht 5\' 8"  (1.727 m)  Wt 196 lb (88.905 kg)  BMI 29.81 kg/m2  SpO2 95% Physical Exam Nursing notes and Vital Signs reviewed. Appearance:  Patient appears healthy, stated age, and in no acute distress Eyes:  Pupils are equal, round, and reactive to light and accomodation.  Extraocular movement is intact.  Conjunctivae are not inflamed.  Fundi benign.  Ears:  Canals normal.  Tympanic membranes normal.  Nose:  Mildly congested turbinates.  No sinus tenderness.   Pharynx:  Normal Neck:  Supple.  No adenopathy or thyromegaly.  Carotids have normal upstrokes without bruits. Lungs:  Clear to auscultation.  Breath sounds are equal.  Heart:  Regular rate and rhythm without murmurs, rubs, or gallops.  Abdomen:  Nontender without masses or hepatosplenomegaly.  Bowel sounds are present.  No CVA or flank  tenderness.  Extremities:  No edema.  No calf tenderness Skin:  No rash present.   Neurologic:  Cranial nerves 2 through 12 are normal.  Patellar, achilles, and elbow reflexes are normal.  Cerebellar function is intact (finger-to-nose and rapid alternating hand movement).  Gait and station are normal.     ED Course  Procedures  none      MDM   1. Benign paroxysmal positional vertigo    Begin Antivert. If symptoms persist follow-up with ENT    Kandra Nicolas, MD 01/27/14 1036

## 2014-01-25 NOTE — ED Notes (Signed)
Vertigo, neck pain, nausea, vomited once yesterday morning

## 2014-01-29 DIAGNOSIS — M531 Cervicobrachial syndrome: Secondary | ICD-10-CM | POA: Diagnosis not present

## 2014-01-29 DIAGNOSIS — M9981 Other biomechanical lesions of cervical region: Secondary | ICD-10-CM | POA: Diagnosis not present

## 2014-01-29 DIAGNOSIS — M62838 Other muscle spasm: Secondary | ICD-10-CM | POA: Diagnosis not present

## 2014-01-30 DIAGNOSIS — M62838 Other muscle spasm: Secondary | ICD-10-CM | POA: Diagnosis not present

## 2014-01-30 DIAGNOSIS — M9981 Other biomechanical lesions of cervical region: Secondary | ICD-10-CM | POA: Diagnosis not present

## 2014-01-30 DIAGNOSIS — M531 Cervicobrachial syndrome: Secondary | ICD-10-CM | POA: Diagnosis not present

## 2014-01-31 DIAGNOSIS — M9981 Other biomechanical lesions of cervical region: Secondary | ICD-10-CM | POA: Diagnosis not present

## 2014-01-31 DIAGNOSIS — M531 Cervicobrachial syndrome: Secondary | ICD-10-CM | POA: Diagnosis not present

## 2014-01-31 DIAGNOSIS — M62838 Other muscle spasm: Secondary | ICD-10-CM | POA: Diagnosis not present

## 2014-02-05 DIAGNOSIS — M9981 Other biomechanical lesions of cervical region: Secondary | ICD-10-CM | POA: Diagnosis not present

## 2014-02-05 DIAGNOSIS — M531 Cervicobrachial syndrome: Secondary | ICD-10-CM | POA: Diagnosis not present

## 2014-02-05 DIAGNOSIS — M62838 Other muscle spasm: Secondary | ICD-10-CM | POA: Diagnosis not present

## 2014-02-12 DIAGNOSIS — M62838 Other muscle spasm: Secondary | ICD-10-CM | POA: Diagnosis not present

## 2014-02-12 DIAGNOSIS — M531 Cervicobrachial syndrome: Secondary | ICD-10-CM | POA: Diagnosis not present

## 2014-02-12 DIAGNOSIS — M9981 Other biomechanical lesions of cervical region: Secondary | ICD-10-CM | POA: Diagnosis not present

## 2014-02-13 DIAGNOSIS — M531 Cervicobrachial syndrome: Secondary | ICD-10-CM | POA: Diagnosis not present

## 2014-02-13 DIAGNOSIS — M62838 Other muscle spasm: Secondary | ICD-10-CM | POA: Diagnosis not present

## 2014-02-13 DIAGNOSIS — M9981 Other biomechanical lesions of cervical region: Secondary | ICD-10-CM | POA: Diagnosis not present

## 2014-02-19 ENCOUNTER — Ambulatory Visit (AMBULATORY_SURGERY_CENTER): Payer: Self-pay | Admitting: *Deleted

## 2014-02-19 VITALS — Ht 68.0 in | Wt 195.8 lb

## 2014-02-19 DIAGNOSIS — Z1211 Encounter for screening for malignant neoplasm of colon: Secondary | ICD-10-CM

## 2014-02-19 MED ORDER — MOVIPREP 100 G PO SOLR: ORAL | Status: DC

## 2014-02-19 NOTE — Progress Notes (Signed)
No egg or soy allergy  No home oxygen use or problems with anesthesia  No medications for weight loss taken  emmi information given 

## 2014-03-05 ENCOUNTER — Encounter: Payer: Self-pay | Admitting: Internal Medicine

## 2014-03-05 ENCOUNTER — Ambulatory Visit (INDEPENDENT_AMBULATORY_CARE_PROVIDER_SITE_OTHER): Payer: Medicare Other

## 2014-03-05 ENCOUNTER — Ambulatory Visit (INDEPENDENT_AMBULATORY_CARE_PROVIDER_SITE_OTHER): Payer: Medicare Other | Admitting: Family Medicine

## 2014-03-05 ENCOUNTER — Encounter: Payer: Self-pay | Admitting: Family Medicine

## 2014-03-05 ENCOUNTER — Ambulatory Visit (AMBULATORY_SURGERY_CENTER): Payer: Medicare Other | Admitting: Internal Medicine

## 2014-03-05 VITALS — BP 123/82 | HR 115 | Wt 193.0 lb

## 2014-03-05 VITALS — BP 140/59 | HR 60 | Temp 96.7°F | Resp 15 | Ht 68.0 in | Wt 195.0 lb

## 2014-03-05 DIAGNOSIS — D126 Benign neoplasm of colon, unspecified: Secondary | ICD-10-CM

## 2014-03-05 DIAGNOSIS — J45909 Unspecified asthma, uncomplicated: Secondary | ICD-10-CM | POA: Diagnosis not present

## 2014-03-05 DIAGNOSIS — H8110 Benign paroxysmal vertigo, unspecified ear: Secondary | ICD-10-CM | POA: Diagnosis not present

## 2014-03-05 DIAGNOSIS — M25571 Pain in right ankle and joints of right foot: Secondary | ICD-10-CM

## 2014-03-05 DIAGNOSIS — Z1211 Encounter for screening for malignant neoplasm of colon: Secondary | ICD-10-CM

## 2014-03-05 DIAGNOSIS — M259 Joint disorder, unspecified: Secondary | ICD-10-CM | POA: Diagnosis not present

## 2014-03-05 DIAGNOSIS — I4510 Unspecified right bundle-branch block: Secondary | ICD-10-CM | POA: Diagnosis not present

## 2014-03-05 DIAGNOSIS — M25579 Pain in unspecified ankle and joints of unspecified foot: Secondary | ICD-10-CM | POA: Diagnosis not present

## 2014-03-05 MED ORDER — SODIUM CHLORIDE 0.9 % IV SOLN: 500.0000 mL | INTRAVENOUS | Status: DC

## 2014-03-05 NOTE — Progress Notes (Signed)
Report to pacu rn, vss, bbs=clear 

## 2014-03-05 NOTE — Progress Notes (Signed)
CC: Jonathan Dawson is a 73 y.o. male is here for glass fell on ankle 2 weeks ago   Subjective: HPI:  Right ankle pain described as mild in severity there has been present ever since one half weeks ago he dropped a window pane on his right anterior ankle. He had immediate pain and it has not gotten better or worse since onset. It is worse with dorsiflexion but nothing else makes better or worse. Interventions have included ice no other interventions. He does not believe that the glass shattered upon impact of his foot but it didn't shatter wants to get the crown. He denies any discharge, accompanied redness, nor bruising but does endorse some mild swelling in the right ankle. Denies motor or sensory disturbances in the right lower extremity.  He tells me that since I saw him last he has been diagnosed with BPV. Symptoms occur if he is ever lying on his back in a supine position and extends his neck. Symptoms are mild in severity. He does not like to take Antivert due to side effects. Symptoms do not come on during any other situation. He denies hearing loss, tinnitus, ear pain, nor headache. Denies any other motor or sensory disturbances recently or remotely. Symptoms have been present for what he estimates to be 6 months and have gotten significantly better after the first 5 days of onset.    Review Of Systems Outlined In HPI  Past Medical History  Diagnosis Date  . Asthma   . Allergy   . Cancer     skin cancer- leg    Past Surgical History  Procedure Laterality Date  . Appendectomy  1981  . Left hand surgery  2011   Family History  Problem Relation Age of Onset  . Diabetes Mother   . Heart failure Father   . Stroke Father   . Alcoholism Father   . Colon cancer Neg Hx   . Esophageal cancer Neg Hx   . Rectal cancer Neg Hx   . Stomach cancer Neg Hx     History   Social History  . Marital Status: Married    Spouse Name: N/A    Number of Children: N/A  . Years of Education: N/A    Occupational History  . Not on file.   Social History Main Topics  . Smoking status: Never Smoker   . Smokeless tobacco: Never Used  . Alcohol Use: Yes     Comment: wine- 2-3 glasses weekly  . Drug Use: No  . Sexual Activity: Yes   Other Topics Concern  . Not on file   Social History Narrative  . No narrative on file     Objective: BP 123/82  Pulse 115  Wt 193 lb (87.544 kg)  General: Alert and Oriented, No Acute Distress HEENT: Pupils equal, round, reactive to light. Conjunctivae clear. Moist mucous membranes pharynx unremarkable.  Lungs: Clear to auscultation bilaterally, no wheezing/ronchi/rales.  Comfortable work of breathing. Good air movement. Cardiac: Regular rate and rhythm. Normal S1/S2.  No murmurs, rubs, nor gallops.   Extremities:  trace nonpitting edema on the anterior surface of the right ankle centrally there is a 2-3 mm well healing scab with any signs of surrounding infection..  Strong peripheral pulses. He has full range of motion strength of the right ankle, no difficulty with extension of all 5 toes nor difficulty with dorsiflexion  Mental Status: No depression, anxiety, nor agitation. Skin: Warm and dry.  Assessment & Plan: Jonathan Dawson was seen  today for glass fell on ankle 2 weeks ago.  Diagnoses and associated orders for this visit:  Right ankle pain - DG Ankle Complete Right; Future  BPV (benign positional vertigo)    BPV: Discussed that symptoms can also be further improved by physical therapy focusing on vestibular therapy, patient states his symptoms are not bad enough that he believes they warrant any interventions. Right ankle pain: X-ray of the ankle was obtained to ensure that he does not have a foreign object in the skin, thankfully no foreign object is seen anywhere in the soft tissues or bones of his left ankle.  Discussed using iodoform overlying the scab changing a daily basis and keeping it clean Band-Aid over it.  Return if symptoms  worsen or fail to improve.

## 2014-03-05 NOTE — Progress Notes (Signed)
The pt asked me about his right ankle.  He dropped glass on the top of his right ankle about 1 week ago.  He said it broke the skin and wonders if glass is under the skin.  The area was swollen per his wife but only has a small round redden area.  He asked if the glass would work itself out.  I recommended that he let his medical md see this area. maw

## 2014-03-05 NOTE — Progress Notes (Signed)
No complaints noted in the recovery room. maw 

## 2014-03-05 NOTE — Patient Instructions (Signed)
YOU HAD AN ENDOSCOPIC PROCEDURE TODAY AT THE [facility]: Refer to the procedure report that was given to you for any specific questions about what was found during the examination.  If the procedure report does not answer your questions, please call your gastroenterologist to clarify.  If you requested that your care partner not be given the details of your procedure findings, then the procedure report has been included in a sealed envelope for you to review at your convenience later.  YOU SHOULD EXPECT: Some feelings of bloating in the abdomen. Passage of more gas than usual.  Walking can help get rid of the air that was put into your GI tract during the procedure and reduce the bloating. If you had a lower endoscopy (such as a colonoscopy or flexible sigmoidoscopy) you may notice spotting of blood in your stool or on the toilet paper. If you underwent a bowel prep for your procedure, then you may not have a normal bowel movement for a few days.  DIET: Your first meal following the procedure should be a light meal and then it is ok to progress to your normal diet.  A half-sandwich or bowl of soup is an example of a good first meal.  Heavy or fried foods are harder to digest and may make you feel nauseous or bloated.  Likewise meals heavy in dairy and vegetables can cause extra gas to form and this can also increase the bloating.  Drink plenty of fluids but you should avoid alcoholic beverages for 24 hours.  ACTIVITY: Your care partner should take you home directly after the procedure.  You should plan to take it easy, moving slowly for the rest of the day.  You can resume normal activity the day after the procedure however you should NOT DRIVE or use heavy machinery for 24 hours (because of the sedation medicines used during the test).    SYMPTOMS TO REPORT IMMEDIATELY: A gastroenterologist can be reached at any hour.  During normal business hours, 8:30 AM to 5:00 PM Monday through Friday,  call [phone].  After hours and on weekends, please call the GI answering service at [phone] who will take a message and have the physician on call contact you.   Following lower endoscopy (colonoscopy or flexible sigmoidoscopy):  Excessive amounts of blood in the stool  Significant tenderness or worsening of abdominal pains  Swelling of the abdomen that is new, acute  Fever of 100F or higher   FOLLOW UP: If any biopsies were taken you will be contacted by phone or by letter within the next 1-3 weeks.  Call your gastroenterologist if you have not heard about the biopsies in 3 weeks.  Our staff will call the home number listed on your records the next business day following your procedure to check on you and address any questions or concerns that you may have at that time regarding the information given to you following your procedure. This is a courtesy call and so if there is no answer at the home number and we have not heard from you through the emergency physician on call, we will assume that you have returned to your regular daily activities without incident.  SIGNATURES/CONFIDENTIALITY: You and/or your care partner have signed paperwork which will be entered into your electronic medical record.  These signatures attest to the fact that that the information above on your After Visit Summary has been reviewed and is understood.  Full responsibility of the confidentiality of   this discharge information lies with you and/or your care-partner.    Handouts were given to your care partner on polyps, diverticulosis and a high fiber with liberal fluid intake. You may resume your current medications today. Await biopsy results. Please call if any questions or concerns.

## 2014-03-05 NOTE — Progress Notes (Signed)
Called to room to assist during endoscopic procedure.  Patient ID and intended procedure confirmed with present staff. Received instructions for my participation in the procedure from the performing physician.  

## 2014-03-05 NOTE — Op Note (Signed)
[place]  [address] [place], [id number]   COLONOSCOPY PROCEDURE REPORT  PATIENT: Jonathan Dawson, Jonathan Dawson  MR#: [id number] BIRTHDATE: [birth year] , 49  yrs. old GENDER: Male ENDOSCOPIST: Jerene Bears, MD REFERRED BY: Marcial Pacas, DO PROCEDURE DATE:  03/05/2014 PROCEDURE:   Colonoscopy with snare polypectomy and Colonoscopy with cold biopsy polypectomy First Screening Colonoscopy - Avg.  risk and is 50 yrs.  old or older Yes.  Prior Negative Screening - Now for repeat screening. N/A  History of Adenoma - Now for follow-up colonoscopy & has been > or = to 3 yrs.  N/A  Polyps Removed Today? Yes. ASA CLASS:   Class II INDICATIONS:average risk screening and first colonoscopy. MEDICATIONS: MAC sedation, administered by CRNA and propofol (Diprivan) 250mg  IV  DESCRIPTION OF PROCEDURE:   After the risks benefits and alternatives of the procedure were thoroughly explained, informed consent was obtained.  A digital rectal exam revealed no rectal mass.   The LB [id number]  endoscope was introduced through the anus and advanced to the cecum, which was identified by both the appendix and ileocecal valve. No adverse events experienced. The quality of the prep was good, using MoviPrep  The instrument was then slowly withdrawn as the colon was fully examined.  COLON FINDINGS: A sessile polyp measuring 3-4 mm in size was found at the cecum.  A polypectomy was performed with cold forceps.  The resection was complete and the polyp tissue was completely retrieved.   Two sessile polyps measuring 5 mm in size were found in the descending colon and rectum.  Polypectomy was performed using cold snare.  All resections were complete and all polyp tissue was completely retrieved.   Mild diverticulosis was noted in the sigmoid colon.  Retroflexed views revealed internal hemorrhoids. The time to cecum=2 minutes 36 seconds.  Withdrawal time=13 minutes 33 seconds.  The scope was withdrawn  and the procedure completed. COMPLICATIONS: There were no complications.  ENDOSCOPIC IMPRESSION: 1.   Sessile polyp measuring 3-4 mm in size was found at the cecum; polypectomy was performed with cold forceps 2.   Two sessile polyps measuring 5 mm in size were found in the descending colon and rectum; Polypectomy was performed using cold snare 3.   Mild diverticulosis was noted in the sigmoid colon  RECOMMENDATIONS: 1.  Await pathology results 2.  High fiber diet 3.  Timing of repeat colonoscopy will be determined by pathology findings. 4.  You will receive a letter within 1-2 weeks with the results of your biopsy as well as final recommendations.  Please call my office if you have not received a letter after 3 weeks.  eSigned:  Jerene Bears, MD 03/05/2014 9:14 AM               cc: The Patient; Marcial Pacas, DO

## 2014-03-06 ENCOUNTER — Telehealth: Payer: Self-pay

## 2014-03-06 NOTE — Telephone Encounter (Signed)
  Follow up Call-  Call back number 03/05/2014  Post procedure Call Back phone  # [phone]  Permission to leave phone message Yes     Patient questions:  Do you have a fever, pain , or abdominal swelling? no Pain Score  0 *  Have you tolerated food without any problems? yes  Have you been able to return to your normal activities? yes  Do you have any questions about your discharge instructions: Diet   no Medications  no Follow up visit  no  Do you have questions or concerns about your Care? no  Actions: * If pain score is 4 or above: No action needed, pain <4.

## 2014-03-13 ENCOUNTER — Encounter: Payer: Self-pay | Admitting: Internal Medicine

## 2014-04-04 ENCOUNTER — Encounter: Payer: Self-pay | Admitting: Family Medicine

## 2014-04-04 DIAGNOSIS — Z860100 Personal history of colon polyps, unspecified: Secondary | ICD-10-CM | POA: Insufficient documentation

## 2014-04-24 DIAGNOSIS — H01009 Unspecified blepharitis unspecified eye, unspecified eyelid: Secondary | ICD-10-CM | POA: Diagnosis not present

## 2014-04-24 DIAGNOSIS — H538 Other visual disturbances: Secondary | ICD-10-CM | POA: Diagnosis not present

## 2014-04-24 DIAGNOSIS — H4389 Other disorders of vitreous body: Secondary | ICD-10-CM | POA: Diagnosis not present

## 2014-04-24 DIAGNOSIS — H539 Unspecified visual disturbance: Secondary | ICD-10-CM | POA: Diagnosis not present

## 2014-04-24 DIAGNOSIS — H2510 Age-related nuclear cataract, unspecified eye: Secondary | ICD-10-CM | POA: Diagnosis not present

## 2014-04-24 DIAGNOSIS — H269 Unspecified cataract: Secondary | ICD-10-CM | POA: Diagnosis not present

## 2014-04-24 DIAGNOSIS — Z872 Personal history of diseases of the skin and subcutaneous tissue: Secondary | ICD-10-CM | POA: Diagnosis not present

## 2014-04-24 DIAGNOSIS — Z859 Personal history of malignant neoplasm, unspecified: Secondary | ICD-10-CM | POA: Diagnosis not present

## 2014-04-24 DIAGNOSIS — E349 Endocrine disorder, unspecified: Secondary | ICD-10-CM | POA: Diagnosis not present

## 2014-04-24 DIAGNOSIS — Z7982 Long term (current) use of aspirin: Secondary | ICD-10-CM | POA: Diagnosis not present

## 2014-04-29 ENCOUNTER — Encounter: Payer: Self-pay | Admitting: Family Medicine

## 2014-04-29 DIAGNOSIS — H539 Unspecified visual disturbance: Secondary | ICD-10-CM | POA: Insufficient documentation

## 2014-05-10 DIAGNOSIS — H539 Unspecified visual disturbance: Secondary | ICD-10-CM | POA: Diagnosis not present

## 2014-05-14 ENCOUNTER — Ambulatory Visit: Payer: Medicare Other | Admitting: Family Medicine

## 2014-05-14 ENCOUNTER — Encounter: Payer: Self-pay | Admitting: Family Medicine

## 2014-05-14 VITALS — BP 125/81 | HR 93 | Ht 68.0 in | Wt 198.0 lb

## 2014-05-14 DIAGNOSIS — Z131 Encounter for screening for diabetes mellitus: Secondary | ICD-10-CM

## 2014-05-14 DIAGNOSIS — Z0000 Encounter for general adult medical examination without abnormal findings: Secondary | ICD-10-CM

## 2014-05-14 DIAGNOSIS — Z125 Encounter for screening for malignant neoplasm of prostate: Secondary | ICD-10-CM

## 2014-05-14 DIAGNOSIS — M25569 Pain in unspecified knee: Secondary | ICD-10-CM | POA: Diagnosis not present

## 2014-05-14 DIAGNOSIS — M25561 Pain in right knee: Secondary | ICD-10-CM | POA: Insufficient documentation

## 2014-05-14 DIAGNOSIS — E785 Hyperlipidemia, unspecified: Secondary | ICD-10-CM

## 2014-05-14 NOTE — Progress Notes (Signed)
Subjective:    Jonathan Dawson is a 73 y.o. male who presents for Medicare Annual/Subsequent preventive examination.   Preventive Screening-Counseling & Management  Tobacco History  Smoking status  . Never Smoker   Smokeless tobacco  . Never Used   Colonoscopy: adenomatous polyp 02/2014, repeat 2020 Prostate: Discussed screening risks/beneifts with patient during today's visit and he would like to proceed with PSA  Influenza Vaccine: I encouraged him to receive this once flu season arrives around September Pneumovax: Pneumovax 2014 he is over due for Prevnar, he would prefer to have this done at a pharmacy due to financial reasons Td/Tdap: Td 2012 up to date Zoster: He still contemplating whether or not he wants this    Problems Prior to Visit 1. right knee pain present only with climbing stairs or descending stairs localized underneath the patella  Current Problems (verified) Patient Active Problem List   Diagnosis Date Noted  . Visual disturbance 04/29/2014  . Personal history of colonic polyps 04/04/2014  . Right wrist pain 10/30/2013  . Enlarged prostate 06/05/2013  . Left wrist sprain 05/07/2013  . Cervical radiculitis 05/07/2013  . Eczema 12/20/2012  . Erectile dysfunction 12/20/2012  . Right bundle branch block 12/20/2012  . Seasonal allergies 12/20/2012  . History of elevated PSA 12/20/2012  . LACERATION, HAND, LEFT 06/13/2011    Medications Prior to Visit Current Outpatient Prescriptions on File Prior to Visit  Medication Sig Dispense Refill  . albuterol (PROVENTIL HFA;VENTOLIN HFA) 108 (90 BASE) MCG/ACT inhaler Inhale two puffs every 4-6 hours only as needed for shortness of breath or wheezing.  1 Inhaler  1  . aspirin 81 MG tablet Take 81 mg by mouth daily.      . fluticasone (FLONASE) 50 MCG/ACT nasal spray Place 2 sprays into the nose daily.  16 g  2  . triamcinolone cream (KENALOG) 0.1 % Apply topically 2 (two) times daily.  30 g  1  . vardenafil  (LEVITRA) 20 MG tablet 5-10mg  PO PRN  20 tablet  0   No current facility-administered medications on file prior to visit.    Current Medications (verified) Current Outpatient Prescriptions  Medication Sig Dispense Refill  . albuterol (PROVENTIL HFA;VENTOLIN HFA) 108 (90 BASE) MCG/ACT inhaler Inhale two puffs every 4-6 hours only as needed for shortness of breath or wheezing.  1 Inhaler  1  . aspirin 81 MG tablet Take 81 mg by mouth daily.      . fluticasone (FLONASE) 50 MCG/ACT nasal spray Place 2 sprays into the nose daily.  16 g  2  . triamcinolone cream (KENALOG) 0.1 % Apply topically 2 (two) times daily.  30 g  1  . vardenafil (LEVITRA) 20 MG tablet 5-10mg  PO PRN  20 tablet  0   No current facility-administered medications for this visit.     Allergies (verified) Penicillins   PAST HISTORY  Family History Family History  Problem Relation Age of Onset  . Diabetes Mother   . Heart failure Father   . Stroke Father   . Alcoholism Father   . Colon cancer Neg Hx   . Esophageal cancer Neg Hx   . Rectal cancer Neg Hx   . Stomach cancer Neg Hx     Social History History  Substance Use Topics  . Smoking status: Never Smoker   . Smokeless tobacco: Never Used  . Alcohol Use: Yes     Comment: wine- 2-3 glasses weekly    Are there smokers in your home (other than  you)?  No  Risk Factors Current exercise habits: Home exercise routine includes calisthenics, stretching and Strength training.  Dietary issues discussed: DASH diet   Cardiac risk factors: advanced age (older than 53 for men, 41 for women) and hypertension.  Depression Screen (Note: if answer to either of the following is "Yes", a more complete depression screening is indicated)   Q1: Over the past two weeks, have you felt down, depressed or hopeless? No  Q2: Over the past two weeks, have you felt little interest or pleasure in doing things? No  Have you lost interest or pleasure in daily life? No  Do you often  feel hopeless? No  Do you cry easily over simple problems? No  Activities of Daily Living In your present state of health, do you have any difficulty performing the following activities?:  Driving? No Managing money?  No Feeding yourself? No Getting from bed to chair? No Climbing a flight of stairs? No Preparing food and eating?: No Bathing or showering? No Getting dressed: No Getting to the toilet? No Using the toilet:No Moving around from place to place: No In the past year have you fallen or had a near fall?:No   Are you sexually active?  No  Do you have more than one partner?  No  Hearing Difficulties: No Do you often ask people to speak up or repeat themselves? No Do you experience ringing or noises in your ears? No Do you have difficulty understanding soft or whispered voices? No   Do you feel that you have a problem with memory? No  Do you often misplace items? No  Do you feel safe at home?  Yes  Cognitive Testing  Alert? Yes  Normal Appearance?Yes  Oriented to person? Yes  Place? Yes   Time? Yes  Recall of three objects?  Yes  Can perform simple calculations? Yes  Displays appropriate judgment?Yes  Can read the correct time from a watch face?Yes   Advanced Directives have been discussed with the patient? Yes   List the Names of Other Physician/Practitioners you currently use: 1.    Indicate any recent Medical Services you may have received from other than [facility] providers in the past year (date may be approximate).  Immunization History  Administered Date(s) Administered  . Pneumococcal Polysaccharide-23 06/05/2013  . Td 06/13/2011    Screening Tests Health Maintenance  Topic Date Due  . Zostavax  05/12/2000  . Influenza Vaccine  05/25/2014  . Colonoscopy  03/06/2019  . Tetanus/tdap  06/12/2021  . Pneumococcal Polysaccharide Vaccine Age 54 And Over  Completed    All answers were reviewed with the patient and necessary referrals were  made:  Marcial Pacas, DO   05/14/2014   History reviewed: allergies, current medications, past family history, past medical history, past social history, past surgical history and problem list  Review of Systems Review of Systems - General ROS: negative for - chills, fever, night sweats, weight gain or weight loss Ophthalmic ROS: negative for - decreased vision Psychological ROS: negative for - anxiety or depression ENT ROS: negative for - hearing change, nasal congestion, tinnitus or allergies Hematological and Lymphatic ROS: negative for - bleeding problems, bruising or swollen lymph nodes Breast ROS: negative Respiratory ROS: no cough, shortness of breath, or wheezing Cardiovascular ROS: no chest pain or dyspnea on exertion Gastrointestinal ROS: no abdominal pain, change in bowel habits, or black or bloody stools Genito-Urinary ROS: negative for - genital discharge, genital ulcers, incontinence or abnormal bleeding from genitals  Musculoskeletal ROS: negative for - joint pain or muscle pain other than that described above Neurological ROS: negative for - headaches or memory loss Dermatological ROS: negative for lumps, mole changes, rash and skin lesion changes   Objective:     Vision by Snellen chart: right eye:20/20, left eye:20/20 Blood pressure 125/81, pulse 93, height 5\' 8"  (1.727 m), weight 198 lb (89.812 kg). Body mass index is 30.11 kg/(m^2).  General: No Acute Distress HEENT: Atraumatic, normocephalic, conjunctivae normal without scleral icterus.  No nasal discharge, hearing grossly intact, TMs with good landmarks bilaterally with no middle ear abnormalities, posterior pharynx clear without oral lesions. Neck: Supple, trachea midline, no cervical nor supraclavicular adenopathy. Pulmonary: Clear to auscultation bilaterally without wheezing, rhonchi, nor rales. Cardiac: Regular rate and rhythm.  No murmurs, rubs, nor gallops. No peripheral edema.  2+ peripheral pulses  bilaterally. Abdomen: Bowel sounds normal.  No masses.  Non-tender without rebound.  Negative Murphy's sign. GU: Bilateral descended testes without inguinal hernia MSK: Grossly intact, no signs of weakness.  Full strength throughout upper and lower extremities.  Full ROM in upper and lower extremities.  No midline spinal tenderness. Neuro: Gait unremarkable, CN II-XII grossly intact.  C5-C6 Reflex 2/4 Bilaterally, L4 Reflex 2/4 Bilaterally.  Cerebellar function intact. Skin: No rashes. Psych: Alert and oriented to person/place/time.  Thought process normal. No anxiety/depression.     Assessment:     Patellofemoral syndrome     Plan:     During the course of the visit the patient was educated and counseled about appropriate screening and preventive services including:    Pneumococcal vaccine   Prostate cancer screening  Nutrition counseling   Diet review for nutrition referral? no  Patient Instructions (the written plan) was given to the patient.  Medicare Attestation I have personally reviewed: The patient's medical and social history Their use of alcohol, tobacco or illicit drugs Their current medications and supplements The patient's functional ability including ADLs,fall risks, home safety risks, cognitive, and hearing and visual impairment Diet and physical activities Evidence for depression or mood disorders  The patient's weight, height, BMI, and visual acuity have been recorded in the chart.  I have made referrals, counseling, and provided education to the patient based on review of the above and I have provided the patient with a written personalized care plan for preventive services.     Marcial Pacas, DO   05/14/2014   Handout was provided for home rehabilitative exercises to help with patellofemoral syndrome

## 2014-05-14 NOTE — Patient Instructions (Addendum)
You are due for a "new" pneumonia vaccine known as "Prevnar"   Dr. Lajoyce Lauber General Advice Following Your Complete Physical Exam  The Benefits of Regular Exercise: Unless you suffer from an uncontrolled cardiovascular condition, studies strongly suggest that regular exercise and physical activity will add to both the quality and length of your life.  The World Health Organization recommends 150 minutes of moderate intensity aerobic activity every week.  This is best split over 3-4 days a week, and can be as simple as a brisk walk for just over 35 minutes "most days of the week".  This type of exercise has been shown to lower LDL-Cholesterol, lower average blood sugars, lower blood pressure, lower cardiovascular disease risk, improve memory, and increase one's overall sense of wellbeing.  The addition of anaerobic (or "strength training") exercises offers additional benefits including but not limited to increased metabolism, prevention of osteoporosis, and improved overall cholesterol levels.  How Can I Strive For A Low-Fat Diet?: Current guidelines recommend that 25-35 percent of your daily energy (food) intake should come from fats.  One might ask how can this be achieved without having to dissect each meal on a daily basis?  Switch to skim or 1% milk instead of whole milk.  Focus on lean meats such as ground [place], fresh fish, baked chicken, and lean cuts of beef as your source of dietary protein.  Limit saturated fat consumption to less than 10% of your daily caloric intake.  Limit trans fatty acid consumption primarily by limiting synthetic trans fats such as partially hydrogenated oils (Ex: fried fast foods).  Substitute olive or vegetable oil for solid fats where possible.  Moderation of Salt Intake: Provided you don't carry a diagnosis of congestive heart failure nor renal failure, I recommend a daily allowance of no more than 2300 mg of salt (sodium).  Keeping under this daily goal is  associated with a decreased risk of cardiovascular events, creeping above it can lead to elevated blood pressures and increases your risk of cardiovascular events.  Milligrams (mg) of salt is listed on all nutrition labels, and your daily intake can add up faster than you think.  Most canned and frozen dinners can pack in over half your daily salt allowance in one meal.    Lifestyle Health Risks: Certain lifestyle choices carry specific health risks.  As you may already know, tobacco use has been associated with increasing one's risk of cardiovascular disease, pulmonary disease, numerous cancers, among many other issues.  What you may not know is that there are medications and nicotine replacement strategies that can more than double your chances of successfully quitting.  I would be thrilled to help manage your quitting strategy if you currently use tobacco products.  When it comes to alcohol use, I've yet to find an "ideal" daily allowance.  Provided an individual does not have a medical condition that is exacerbated by alcohol consumption, general guidelines determine "safe drinking" as no more than two standard drinks for a man or no more than one standard drink for a male per day.  However, much debate still exists on whether any amount of alcohol consumption is technically "safe".  My general advice, keep alcohol consumption to a minimum for general health promotion.  If you or others believe that alcohol, tobacco, or recreational drug use is interfering with your life, I would be happy to provide confidential counseling regarding treatment options.  General "Over The Counter" Nutrition Advice: Postmenopausal women should aim for a daily calcium  intake of 1200 mg, however a significant portion of this might already be provided by diets including milk, yogurt, cheese, and other dairy products.  Vitamin D has been shown to help preserve bone density, prevent fatigue, and has even been shown to help reduce  falls in the elderly.  Ensuring a daily intake of 800 Units of Vitamin D is a good place to start to enjoy the above benefits, we can easily check your Vitamin D level to see if you'd potentially benefit from supplementation beyond 800 Units a day.  Folic Acid intake should be of particular concern to women of childbearing age.  Daily consumption of 914-782 mcg of Folic Acid is recommended to minimize the chance of spinal cord defects in a fetus should pregnancy occur.    For many adults, accidents still remain one of the most common culprits when it comes to cause of death.  Some of the simplest but most effective preventitive habits you can adopt include regular seatbelt use, proper helmet use, securing firearms, and regularly testing your smoke and carbon monoxide detectors.  Adylynn Hertenstein B. [address] [place] [address], [place], [place] [address] Phone: [phone]

## 2014-05-17 DIAGNOSIS — Z125 Encounter for screening for malignant neoplasm of prostate: Secondary | ICD-10-CM | POA: Diagnosis not present

## 2014-05-17 DIAGNOSIS — Z0000 Encounter for general adult medical examination without abnormal findings: Secondary | ICD-10-CM | POA: Diagnosis not present

## 2014-05-17 DIAGNOSIS — E785 Hyperlipidemia, unspecified: Secondary | ICD-10-CM | POA: Diagnosis not present

## 2014-05-17 DIAGNOSIS — Z131 Encounter for screening for diabetes mellitus: Secondary | ICD-10-CM | POA: Diagnosis not present

## 2014-05-17 LAB — BASIC METABOLIC PANEL
BUN: 21 mg/dL (ref 6–23)
CO2: 26 meq/L (ref 19–32)
Calcium: 9 mg/dL (ref 8.4–10.5)
Chloride: 101 mEq/L (ref 96–112)
Creat: 0.9 mg/dL (ref 0.50–1.35)
Glucose, Bld: 93 mg/dL (ref 70–99)
Potassium: 4.6 mEq/L (ref 3.5–5.3)
SODIUM: 137 meq/L (ref 135–145)

## 2014-05-17 LAB — LIPID PANEL
CHOL/HDL RATIO: 2.7 ratio
CHOLESTEROL: 144 mg/dL (ref 0–200)
HDL: 54 mg/dL (ref >39)
LDL Cholesterol: 76 mg/dL (ref 0–99)
Triglycerides: 69 mg/dL (ref <150)
VLDL: 14 mg/dL (ref 0–40)

## 2014-05-18 LAB — PSA: PSA: 4.03 ng/mL — ABNORMAL HIGH (ref <=4.00)

## 2014-05-22 ENCOUNTER — Telehealth: Payer: Self-pay | Admitting: *Deleted

## 2014-05-22 NOTE — Telephone Encounter (Signed)
Message left on vm 

## 2014-05-22 NOTE — Telephone Encounter (Signed)
Pt would like to know if you know of any natural supplements for prostate health

## 2014-05-22 NOTE — Telephone Encounter (Signed)
The only natural substance that has had clinical trials that proved any benefit is saw palmetto 160 mg twice daily.  This can be picked up at most pharmacies or health food stores.

## 2014-05-29 DIAGNOSIS — H2510 Age-related nuclear cataract, unspecified eye: Secondary | ICD-10-CM | POA: Diagnosis not present

## 2014-05-29 DIAGNOSIS — H539 Unspecified visual disturbance: Secondary | ICD-10-CM | POA: Diagnosis not present

## 2014-07-12 ENCOUNTER — Other Ambulatory Visit: Payer: Self-pay | Admitting: *Deleted

## 2014-07-12 MED ORDER — FLUTICASONE PROPIONATE 50 MCG/ACT NA SUSP: 2.0000 | Freq: Every day | NASAL | Status: DC

## 2014-07-17 DIAGNOSIS — Z85828 Personal history of other malignant neoplasm of skin: Secondary | ICD-10-CM | POA: Diagnosis not present

## 2014-07-17 DIAGNOSIS — D485 Neoplasm of uncertain behavior of skin: Secondary | ICD-10-CM | POA: Diagnosis not present

## 2014-07-17 DIAGNOSIS — C44721 Squamous cell carcinoma of skin of unspecified lower limb, including hip: Secondary | ICD-10-CM | POA: Diagnosis not present

## 2014-07-17 DIAGNOSIS — L280 Lichen simplex chronicus: Secondary | ICD-10-CM | POA: Diagnosis not present

## 2014-07-18 ENCOUNTER — Other Ambulatory Visit: Payer: Self-pay | Admitting: *Deleted

## 2014-07-18 MED ORDER — FLUTICASONE PROPIONATE 50 MCG/ACT NA SUSP: 2.0000 | Freq: Every day | NASAL | Status: DC

## 2014-07-18 MED ORDER — FLUTICASONE PROPIONATE 50 MCG/ACT NA SUSP: 2.0000 | Freq: Every day | NASAL | Status: AC

## 2014-07-18 NOTE — Telephone Encounter (Signed)
[facility] called for refill of flonase. Margette Fast, CMA

## 2014-08-29 ENCOUNTER — Ambulatory Visit: Payer: Medicare Other | Admitting: Sports Medicine

## 2014-08-30 ENCOUNTER — Ambulatory Visit (INDEPENDENT_AMBULATORY_CARE_PROVIDER_SITE_OTHER): Payer: Medicare Other | Admitting: Sports Medicine

## 2014-08-30 ENCOUNTER — Encounter: Payer: Self-pay | Admitting: Sports Medicine

## 2014-08-30 VITALS — BP 142/81 | HR 82 | Ht 67.0 in | Wt 196.0 lb

## 2014-08-30 DIAGNOSIS — J209 Acute bronchitis, unspecified: Secondary | ICD-10-CM

## 2014-08-30 MED ORDER — AZITHROMYCIN 250 MG PO TABS: ORAL_TABLET | ORAL | Status: DC

## 2014-08-30 MED ORDER — HYDROCOD POLST-CHLORPHEN POLST 10-8 MG/5ML PO LQCR: 5.0000 mL | Freq: Two times a day (BID) | ORAL | Status: DC | PRN

## 2014-08-30 NOTE — Assessment & Plan Note (Signed)
Present now for nearly 2 weeks. Tussionex, azithromycin. Return to see [place] if no better in one week.

## 2014-08-30 NOTE — Progress Notes (Signed)
  Subjective:    CC: coughing  HPI: For the past 1-2 weeks Jonathan Dawson has had increasing cough, weakness. Symptoms are moderate, persistent, no chest pain, lower extremity swelling.  Past medical history, Surgical history, Family history not pertinant except as noted below, Social history, Allergies, and medications have been entered into the medical record, reviewed, and no changes needed.   Review of Systems: No fevers, chills, night sweats, weight loss, chest pain, or shortness of breath.   Objective:    General: Well Developed, well nourished, and in no acute distress.  Neuro: Alert and oriented x3, extra-ocular muscles intact, sensation grossly intact.  HEENT: Normocephalic, atraumatic, pupils equal round reactive to light, neck supple, no masses, no lymphadenopathy, thyroid nonpalpable. Oropharynx, nasopharynx, external ear canals are unremarkable. Skin: Warm and dry, no rashes. Cardiac: Regular rate and rhythm, no murmurs rubs or gallops, no lower extremity edema.  Respiratory: Clear to auscultation bilaterally. Not using accessory muscles, speaking in full sentences.  Impression and Recommendations:

## 2014-08-30 NOTE — Patient Instructions (Signed)

## 2014-09-06 ENCOUNTER — Telehealth: Payer: Self-pay | Admitting: Family Medicine

## 2014-09-06 DIAGNOSIS — N529 Male erectile dysfunction, unspecified: Secondary | ICD-10-CM

## 2014-09-06 MED ORDER — VARDENAFIL HCL 20 MG PO TABS: ORAL_TABLET | ORAL | Status: DC

## 2014-09-06 NOTE — Telephone Encounter (Signed)
Refill

## 2014-09-30 DIAGNOSIS — C44729 Squamous cell carcinoma of skin of left lower limb, including hip: Secondary | ICD-10-CM | POA: Diagnosis not present

## 2014-09-30 DIAGNOSIS — Z85828 Personal history of other malignant neoplasm of skin: Secondary | ICD-10-CM | POA: Diagnosis not present

## 2014-09-30 DIAGNOSIS — L570 Actinic keratosis: Secondary | ICD-10-CM | POA: Diagnosis not present

## 2014-09-30 DIAGNOSIS — L281 Prurigo nodularis: Secondary | ICD-10-CM | POA: Diagnosis not present

## 2014-09-30 DIAGNOSIS — D485 Neoplasm of uncertain behavior of skin: Secondary | ICD-10-CM | POA: Diagnosis not present

## 2014-10-04 DIAGNOSIS — B353 Tinea pedis: Secondary | ICD-10-CM | POA: Diagnosis not present

## 2014-10-04 DIAGNOSIS — B351 Tinea unguium: Secondary | ICD-10-CM | POA: Diagnosis not present

## 2014-10-04 DIAGNOSIS — M79672 Pain in left foot: Secondary | ICD-10-CM | POA: Diagnosis not present

## 2014-10-26 DIAGNOSIS — J40 Bronchitis, not specified as acute or chronic: Secondary | ICD-10-CM | POA: Diagnosis not present

## 2014-10-30 DIAGNOSIS — H2513 Age-related nuclear cataract, bilateral: Secondary | ICD-10-CM | POA: Diagnosis not present

## 2014-11-01 ENCOUNTER — Ambulatory Visit (INDEPENDENT_AMBULATORY_CARE_PROVIDER_SITE_OTHER): Payer: Medicare Other | Admitting: Physician Assistant

## 2014-11-01 ENCOUNTER — Encounter: Payer: Self-pay | Admitting: Physician Assistant

## 2014-11-01 VITALS — BP 120/73 | HR 92 | Ht 67.0 in | Wt 196.0 lb

## 2014-11-01 DIAGNOSIS — H6981 Other specified disorders of Eustachian tube, right ear: Secondary | ICD-10-CM

## 2014-11-01 DIAGNOSIS — H9201 Otalgia, right ear: Secondary | ICD-10-CM | POA: Diagnosis not present

## 2014-11-01 DIAGNOSIS — H6980 Other specified disorders of Eustachian tube, unspecified ear: Secondary | ICD-10-CM | POA: Insufficient documentation

## 2014-11-01 MED ORDER — HYDROCORTISONE-ACETIC ACID 1-2 % OT SOLN: 3.0000 [drp] | Freq: Three times a day (TID) | OTIC | Status: DC

## 2014-11-01 NOTE — Patient Instructions (Addendum)
Tea tree oil.  Stay on prednisone for next 5 days.  Consider ear drops for discomfort and inflammation.  Stay on flonase once daily 2 sprays.   Barotitis Media Barotitis media is inflammation of your middle ear. This occurs when the auditory tube (eustachian tube) leading from the back of your nose (nasopharynx) to your eardrum is blocked. This blockage may result from a cold, environmental allergies, or an upper respiratory infection. Unresolved barotitis media may lead to damage or hearing loss (barotrauma), which may become permanent. HOME CARE INSTRUCTIONS   Use medicines as recommended by your health care provider. Over-the-counter medicines will help unblock the canal and can help during times of air travel.  Do not put anything into your ears to clean or unplug them. Eardrops will not be helpful.  Do not swim, dive, or fly until your health care provider says it is all right to do so. If these activities are necessary, chewing gum with frequent, forceful swallowing may help. It is also helpful to hold your nose and gently blow to pop your ears for equalizing pressure changes. This forces air into the eustachian tube.  Only take over-the-counter or prescription medicines for pain, discomfort, or fever as directed by your health care provider.  A decongestant may be helpful in decongesting the middle ear and make pressure equalization easier. SEEK MEDICAL CARE IF:  You experience a serious form of dizziness in which you feel as if the room is spinning and you feel nauseated (vertigo).  Your symptoms only involve one ear. SEEK IMMEDIATE MEDICAL CARE IF:   You develop a severe headache, dizziness, or severe ear pain.  You have bloody or pus-like drainage from your ears.  You develop a fever.  Your problems do not improve or become worse. MAKE SURE YOU:   Understand these instructions.  Will watch your condition.  Will get help right away if you are not doing well or get  worse. Document Released: 10/08/2000 Document Revised: 08/01/2013 Document Reviewed: 05/08/2013 [facility] Patient Information 2015 [place], [place]. This information is not intended to replace advice given to you by your health care provider. Make sure you discuss any questions you have with your health care provider.

## 2014-11-01 NOTE — Progress Notes (Signed)
   Subjective:    Patient ID: Jonathan Dawson, male    DOB: 10-Nov-1940, 74 y.o.   MRN: [id number]  HPI Pt presents to the clinic with right ear pain that continues after prompt care visit on 10/26/14. He was treated for bronchitis and given azithromycin, prednisone, Flonase. Most of his symptoms have resolved except his right ear pain and popping. He wears hearing aids and wants to make sure it is okay to continue to wear them. He has not tried anything for the ear popping. The prednisone seemed to make him more anxious so he has made his own taper to once a day and is on. He has not been using the Flonase regularly.  Review of Systems  All other systems reviewed and are negative.      Objective:   Physical Exam  Constitutional: He is oriented to person, place, and time. He appears well-developed and well-nourished.  HENT:  Head: Normocephalic and atraumatic.  Nose: Nose normal.  Mouth/Throat: Oropharynx is clear and moist. No oropharyngeal exudate.  Bilateral TM's slightly buldging with air bubbles behind TM's. No blood or pus. No perforation. Ossicles able to be viewed bilaterally. No tenderness over tragus.   Eyes: Conjunctivae are normal. Right eye exhibits no discharge. Left eye exhibits no discharge.  Neck: Normal range of motion. Neck supple.  Cardiovascular: Normal rate, regular rhythm and normal heart sounds.   Pulmonary/Chest: Effort normal and breath sounds normal. He has no wheezes.  Lymphadenopathy:    He has no cervical adenopathy.  Neurological: He is alert and oriented to person, place, and time.  Skin: Skin is dry.  Psychiatric: He has a normal mood and affect. His behavior is normal.          Assessment & Plan:  Right ear pain/ETD- reassured pt that ear were not perforated or infected. Appears to be eustachian dysfunction. Discussed to finish prednisone. flonase 2 sprays each nostril daily. Chew gum. Consider tea tree oil and sent acetic acid drop to help with  inflammation. Pt ok to wear hearing aids.

## 2014-11-06 ENCOUNTER — Telehealth: Payer: Self-pay

## 2014-11-06 NOTE — Telephone Encounter (Signed)
Jonathan Dawson reports continued bilateral ear pain. He states Jonathan Dawson mentioned something about ear drops. I advised him that the ear drops was sent to the [facility] on the day of his visit. He said he will go and pick them up.

## 2014-11-07 ENCOUNTER — Ambulatory Visit (INDEPENDENT_AMBULATORY_CARE_PROVIDER_SITE_OTHER): Payer: Medicare Other | Admitting: Family Medicine

## 2014-11-07 ENCOUNTER — Encounter: Payer: Self-pay | Admitting: Family Medicine

## 2014-11-07 VITALS — BP 152/90 | HR 92 | Temp 98.1°F | Wt 195.0 lb

## 2014-11-07 DIAGNOSIS — N23 Unspecified renal colic: Secondary | ICD-10-CM | POA: Diagnosis not present

## 2014-11-07 DIAGNOSIS — H6983 Other specified disorders of Eustachian tube, bilateral: Secondary | ICD-10-CM

## 2014-11-07 DIAGNOSIS — M545 Low back pain, unspecified: Secondary | ICD-10-CM

## 2014-11-07 LAB — POCT URINALYSIS DIPSTICK
Bilirubin, UA: NEGATIVE
Blood, UA: NEGATIVE
GLUCOSE UA: NEGATIVE
Ketones, UA: NEGATIVE
LEUKOCYTES UA: NEGATIVE
Nitrite, UA: NEGATIVE
PROTEIN UA: NEGATIVE
Spec Grav, UA: 1.015
Urobilinogen, UA: 0.2
pH, UA: 7

## 2014-11-07 MED ORDER — METHYLPREDNISOLONE ACETATE 80 MG/ML IJ SUSP
80.0000 mg | Freq: Once | INTRAMUSCULAR | Status: AC
  Administered 2014-11-07: 80 mg via INTRAMUSCULAR

## 2014-11-07 NOTE — Progress Notes (Signed)
CC: Jonathan Dawson is a 74 y.o. male is here for Sore Throat and Flank Pain   Subjective: HPI:  Complains of sore throat, subjective postnasal drip and fullness of both ears that has been present for 2 weeks. Symptoms slightly improved with azithromycin and prednisone. He was only able to tolerate 60 mg of prednisone for 1 day due to developing anxiety he cut down to only 20 mg daily after that for what sounds like 7 further days. He was provided hydrocortisone eardrops that he started yesterday but has not had any response to it. He's been taking Flonase twice a week since the symptoms began. Symptoms are present all hours of the day but not interfere with sleep Denies fevers, chills, cough, wheezing, chest pain, difficulty swallowing. Denies any motor or sensory disturbances other than that described above.  Complains of bilateral flank pain that was mostly present yesterday but mildly present today. Nothing particularly makes it better or worse. He denies dysuria, blood in urine, urinary hesitancy or urgency. Denies any positional component to his pain. Denies any gastrointestinal complaints or midline back pain.   Review Of Systems Outlined In HPI  Past Medical History  Diagnosis Date  . Asthma   . Allergy   . Cancer     skin cancer- leg    Past Surgical History  Procedure Laterality Date  . Appendectomy  1981  . Left hand surgery  2011   Family History  Problem Relation Age of Onset  . Diabetes Mother   . Heart failure Father   . Stroke Father   . Alcoholism Father   . Colon cancer Neg Hx   . Esophageal cancer Neg Hx   . Rectal cancer Neg Hx   . Stomach cancer Neg Hx     History   Social History  . Marital Status: Married    Spouse Name: N/A    Number of Children: N/A  . Years of Education: N/A   Occupational History  . Not on file.   Social History Main Topics  . Smoking status: Never Smoker   . Smokeless tobacco: Never Used  . Alcohol Use: Yes     Comment:  wine- 2-3 glasses weekly  . Drug Use: No  . Sexual Activity: Yes   Other Topics Concern  . Not on file   Social History Narrative     Objective: BP 152/90 mmHg  Pulse 92  Temp(Src) 98.1 F (36.7 C) (Oral)  Wt 195 lb (88.451 kg)  General: Alert and Oriented, No Acute Distress HEENT: Pupils equal, round, reactive to light. Conjunctivae clear.  External ears unremarkable, canals clear with intact TMs with appropriate landmarks.  Middle ear appears to have a serous effusion bilaterally.. Pink inferior turbinates.  Moist mucous membranes, pharynx with mild erythema and postnasal drip and cobblestoning but no lesions.  Neck supple without palpable lymphadenopathy nor abnormal masses. Lungs: Clear to auscultation bilaterally, no wheezing/ronchi/rales.  Comfortable work of breathing. Good air movement. Cardiac: Regular rate and rhythm. Normal S1/S2.  No murmurs, rubs, nor gallops.   Back: No CVA tenderness bilaterally, mild paraspinal musculature tenderness to palpation in the lower thoracic region. No midline spinous process tenderness in the thoracolumbar region. Extremities: No peripheral edema.  Strong peripheral pulses.  Mental Status: No depression, anxiety, nor agitation. Skin: Warm and dry.  Assessment & Plan: Vandy was seen today for sore throat and flank pain.  Diagnoses and associated orders for this visit:  Kidney pain - Urinalysis Dipstick - Urine Culture  Eustachian tube dysfunction, bilateral - methylPREDNISolone acetate (DEPO-MEDROL) injection 80 mg; Inject 1 mL (80 mg total) into the muscle once.  Midline low back pain without sciatica    Suspect his low back pain is due to muscular strain rather than kidney pain, obtaining urine culture to fully rule out urinary tract infection. Eustachian tube dysfunction, I prefer him to be on prednisone taper however he was unable to tolerate doses of up to 60 mg therefore Depo-Medrol injection today followed by Afrin for 3 days  and begin to be more diligent about daily Flonase use.   Return if symptoms worsen or fail to improve.

## 2014-11-07 NOTE — Patient Instructions (Signed)
Start Afrin nasal spray (over the counter) twice a day for only three days, continue using Flonase daily for at least one week.  Call me if no better in one week.

## 2014-11-09 LAB — URINE CULTURE
Colony Count: NO GROWTH
ORGANISM ID, BACTERIA: NO GROWTH

## 2014-11-19 ENCOUNTER — Ambulatory Visit (INDEPENDENT_AMBULATORY_CARE_PROVIDER_SITE_OTHER): Payer: Medicare Other | Admitting: Family Medicine

## 2014-11-19 ENCOUNTER — Encounter: Payer: Self-pay | Admitting: Family Medicine

## 2014-11-19 VITALS — BP 148/87 | HR 70 | Temp 97.8°F | Wt 198.0 lb

## 2014-11-19 DIAGNOSIS — B9689 Other specified bacterial agents as the cause of diseases classified elsewhere: Secondary | ICD-10-CM

## 2014-11-19 DIAGNOSIS — A499 Bacterial infection, unspecified: Secondary | ICD-10-CM

## 2014-11-19 DIAGNOSIS — J329 Chronic sinusitis, unspecified: Secondary | ICD-10-CM | POA: Diagnosis not present

## 2014-11-19 MED ORDER — PREDNISONE 20 MG PO TABS
ORAL_TABLET | ORAL | Status: AC
Start: 2014-11-19 — End: 2014-11-24

## 2014-11-19 MED ORDER — DOXYCYCLINE HYCLATE 100 MG PO TABS: ORAL_TABLET | ORAL | Status: AC

## 2014-11-19 NOTE — Progress Notes (Signed)
CC: Jonathan Dawson is a 74 y.o. male is here for Ear Pain and Sinusitis   Subjective: HPI:   since I saw him last he used Afrin for 3 days and has been using a nasal corticosteroid , Flonase. He states that facial pressure in the forehead and cheeks has worsened since I saw him last however here fullness has improved but is still not back to his baseline. He still reports pressure in both ears that is persistent today. No new hearing loss or any motor or sensory disturbances. Mild  Sore throat and nonproductive cough.  Symptoms are present all hours today not interfere with sleep. Reports overall there are mild to moderate that have been persistent to some degree ever since January 1. Denies fevers, chills, confusion, shortness of breath. Review of systems Positive for wheezing.   Review Of Systems Outlined In HPI  Past Medical History  Diagnosis Date  . Asthma   . Allergy   . Cancer     skin cancer- leg    Past Surgical History  Procedure Laterality Date  . Appendectomy  1981  . Left hand surgery  2011   Family History  Problem Relation Age of Onset  . Diabetes Mother   . Heart failure Father   . Stroke Father   . Alcoholism Father   . Colon cancer Neg Hx   . Esophageal cancer Neg Hx   . Rectal cancer Neg Hx   . Stomach cancer Neg Hx     History   Social History  . Marital Status: Married    Spouse Name: N/A    Number of Children: N/A  . Years of Education: N/A   Occupational History  . Not on file.   Social History Main Topics  . Smoking status: Never Smoker   . Smokeless tobacco: Never Used  . Alcohol Use: Yes     Comment: wine- 2-3 glasses weekly  . Drug Use: No  . Sexual Activity: Yes   Other Topics Concern  . Not on file   Social History Narrative     Objective: BP 148/87 mmHg  Pulse 70  Temp(Src) 97.8 F (36.6 C) (Oral)  Wt 198 lb (89.812 kg)  General: Alert and Oriented, No Acute Distress HEENT: Pupils equal, round, reactive to light.  Conjunctivae clear.  External ears unremarkable, canals clear with intact TMs with appropriate landmarks.  Middle ear appears open without effusion. Pink inferior  Turbinates with mild mucoid discharge.  Moist mucous membranes, pharynx without inflammation nor lesions.  Neck supple without palpable lymphadenopathy nor abnormal masses. Lungs: Clear to auscultation bilaterally, no wheezing/ronchi/rales.  Comfortable work of breathing. Good air movement. Extremities: No peripheral edema.  Strong peripheral pulses.  Mental Status: No depression, anxiety, nor agitation. Skin: Warm and dry.  Assessment & Plan: Harlis was seen today for ear pain and sinusitis.  Diagnoses and associated orders for this visit:  Bacterial sinusitis - doxycycline (VIBRA-TABS) 100 MG tablet; One by mouth twice a day for ten days. - predniSONE (DELTASONE) 20 MG tablet; Two tabs at once daily for five days.     bacterial sinusitis: Start doxycycline, at 60 mg of prednisone he had anxiety that was intolerable however he tolerated 20, he is open to the idea of trying 40 mg a day to help speed  Resolution of facial pressure,  Sinusitis, wheezing if no better  After 2 days of doxycycline,  Essentially Thursday.  If no better by Monday call me for referral to ear nose  and throat   Return if symptoms worsen or fail to improve.

## 2014-12-11 ENCOUNTER — Telehealth: Payer: Self-pay | Admitting: *Deleted

## 2014-12-11 DIAGNOSIS — H6983 Other specified disorders of Eustachian tube, bilateral: Secondary | ICD-10-CM

## 2014-12-11 DIAGNOSIS — J329 Chronic sinusitis, unspecified: Principal | ICD-10-CM

## 2014-12-11 DIAGNOSIS — G8929 Other chronic pain: Secondary | ICD-10-CM

## 2014-12-11 DIAGNOSIS — H9203 Otalgia, bilateral: Secondary | ICD-10-CM

## 2014-12-11 DIAGNOSIS — B9689 Other specified bacterial agents as the cause of diseases classified elsewhere: Secondary | ICD-10-CM

## 2014-12-11 DIAGNOSIS — H6993 Unspecified Eustachian tube disorder, bilateral: Secondary | ICD-10-CM

## 2014-12-11 DIAGNOSIS — H6990 Unspecified Eustachian tube disorder, unspecified ear: Secondary | ICD-10-CM

## 2014-12-11 DIAGNOSIS — H6980 Other specified disorders of Eustachian tube, unspecified ear: Secondary | ICD-10-CM

## 2014-12-11 MED ORDER — LEVOFLOXACIN 500 MG PO TABS: 500.0000 mg | ORAL_TABLET | Freq: Every day | ORAL | Status: DC

## 2014-12-11 NOTE — Telephone Encounter (Signed)
Pt would like referral to ENT and abx for amoxicillin for ST and ear pain. Referral placed

## 2014-12-11 NOTE — Telephone Encounter (Signed)
Referral placed and levofloxacin sent to [facility], i chose this instead of amoxicillin since he has an intolerance to penicillin

## 2014-12-11 NOTE — Telephone Encounter (Signed)
Pt.notified

## 2014-12-19 DIAGNOSIS — J301 Allergic rhinitis due to pollen: Secondary | ICD-10-CM | POA: Diagnosis not present

## 2015-02-17 ENCOUNTER — Other Ambulatory Visit: Payer: Self-pay | Admitting: Family Medicine

## 2015-04-20 ENCOUNTER — Emergency Department (INDEPENDENT_AMBULATORY_CARE_PROVIDER_SITE_OTHER)
Admission: EM | Admit: 2015-04-20 | Discharge: 2015-04-20 | Disposition: A | Discharge status: Home / Self Care | Payer: Medicare Other | Source: Home / Self Care | Attending: Family Medicine | Admitting: Family Medicine

## 2015-04-20 ENCOUNTER — Encounter: Payer: Self-pay | Admitting: Emergency Medicine

## 2015-04-20 DIAGNOSIS — J209 Acute bronchitis, unspecified: Secondary | ICD-10-CM | POA: Diagnosis not present

## 2015-04-20 DIAGNOSIS — J9801 Acute bronchospasm: Secondary | ICD-10-CM | POA: Diagnosis not present

## 2015-04-20 MED ORDER — TRIAMCINOLONE ACETONIDE 40 MG/ML IJ SUSP
40.0000 mg | Freq: Once | INTRAMUSCULAR | Status: AC
  Administered 2015-04-20: 40 mg via INTRAMUSCULAR

## 2015-04-20 MED ORDER — BENZONATATE 200 MG PO CAPS: 200.0000 mg | ORAL_CAPSULE | Freq: Every day | ORAL | Status: DC

## 2015-04-20 MED ORDER — AMOXICILLIN 875 MG PO TABS: 875.0000 mg | ORAL_TABLET | Freq: Two times a day (BID) | ORAL | Status: DC

## 2015-04-20 NOTE — Discharge Instructions (Signed)
Take plain guaifenesin (1200mg  extended release tabs such as Mucinex) twice daily, with plenty of water, for cough and congestion.  May add Pseudoephedrine (30mg , one or two every 4 to 6 hours) for sinus congestion.  Get adequate rest.   May use Afrin nasal spray (or generic oxymetazoline) twice daily for about 5 days.  Also recommend using saline nasal spray several times daily and saline nasal irrigation (AYR is a common brand).  Use Flonase nasal spray each morning after using Afrin nasal spray and saline nasal irrigation. Try warm salt water gargles for sore throat. Continue albuterol inhaler as needed.  Stop all antihistamines for now, and other non-prescription cough/cold preparations.  Follow-up with family doctor if not improving about 7 to10 days.

## 2015-04-20 NOTE — ED Notes (Signed)
Patient reports cough and congestion over past 10 days; wonders if it is bronchitis.

## 2015-04-20 NOTE — ED Provider Notes (Signed)
CSN: [id number]     Arrival date & time 04/20/15  1122 History   First MD Initiated Contact with Patient 04/20/15 1237     Chief Complaint  Patient presents with  . Cough  . Nasal Congestion      HPI Comments: Patient suddenly developed dry cough about 10 days ago with sinus congestion and sneezing, but no sore throat.  During the past 2 to 3 days he has developed chills, and has increased his use of his albuterol inhaler.  No pleuritic pain.  His cough is worse at night. He has had no improvement with Zyrtec, Mucinex D, and Flonase.  The history is provided by the patient.    Past Medical History  Diagnosis Date  . Asthma   . Allergy   . Cancer     skin cancer- leg   Past Surgical History  Procedure Laterality Date  . Appendectomy  1981  . Left hand surgery  2011   Family History  Problem Relation Age of Onset  . Diabetes Mother   . Heart failure Father   . Stroke Father   . Alcoholism Father   . Colon cancer Neg Hx   . Esophageal cancer Neg Hx   . Rectal cancer Neg Hx   . Stomach cancer Neg Hx    History  Substance Use Topics  . Smoking status: Never Smoker   . Smokeless tobacco: Never Used  . Alcohol Use: Yes     Comment: wine- 2-3 glasses weekly    Review of Systems No sore throat + cough + sneezing No pleuritic pain ? wheezing + nasal congestion + post-nasal drainage No sinus pain/pressure No itchy/red eyes No earache No hemoptysis No SOB No fever, + chills No nausea No vomiting No abdominal pain No diarrhea No urinary symptoms No skin rash + fatigue No myalgias No headache Used OTC meds without relief  Allergies  Penicillins  Home Medications   Prior to Admission medications   Medication Sig Start Date End Date Taking? Authorizing Provider  acetic acid-hydrocortisone (VOSOL-HC) otic solution Place 3 drops into both ears 3 (three) times daily. For 7 days or as needed. 11/01/14   Jade L Breeback, PA-C  amoxicillin (AMOXIL) 875 MG tablet  Take 1 tablet (875 mg total) by mouth 2 (two) times daily. 04/20/15   Kandra Nicolas, MD  aspirin 81 MG tablet Take 81 mg by mouth daily.    Historical Provider, MD  benzonatate (TESSALON) 200 MG capsule Take 1 capsule (200 mg total) by mouth at bedtime. Take as needed for cough 04/20/15   Kandra Nicolas, MD  CIALIS 20 MG tablet TAKE ONE TABLET BY MOUTH ONCE DAILY AS NEEDED FOR ERECTILE DYSFUNCTION. TAKE 30 TO 45 MINUTES PRIOR TO ACTIVITY 02/18/15   Sean Hommel, DO  fluticasone (FLONASE) 50 MCG/ACT nasal spray Place 2 sprays into both nostrils daily. 07/18/14   Sean Hommel, DO  levofloxacin (LEVAQUIN) 500 MG tablet Take 1 tablet (500 mg total) by mouth daily. 12/11/14   Marcial Pacas, DO  triamcinolone cream (KENALOG) 0.1 % Apply topically 2 (two) times daily. 12/20/12   Marcial Pacas, DO  vardenafil (LEVITRA) 20 MG tablet 5-10mg  PO PRN 09/06/14   Sean Hommel, DO   BP 124/81 mmHg  Pulse 79  Temp(Src) 98.7 F (37.1 C) (Oral)  Resp 16  Ht 5\' 8"  (1.727 m)  Wt 192 lb (87.091 kg)  BMI 29.20 kg/m2  SpO2 96% Physical Exam Nursing notes and Vital Signs reviewed. Appearance:  Patient appears stated age, and in no acute distress Eyes:  Pupils are equal, round, and reactive to light and accomodation.  Extraocular movement is intact.  Conjunctivae are not inflamed  Ears:  Canals normal.  Tympanic membranes normal.  Nose:  Mildly congested turbinates.  No sinus tenderness.  Pharynx:  Normal Neck:  Supple.  No adenopathy Lungs:  Clear to auscultation.  Breath sounds are equal. Good air movement. Heart:  Regular rate and rhythm without murmurs, rubs, or gallops.  Abdomen:  Nontender without masses or hepatosplenomegaly.  Bowel sounds are present.  No CVA or flank tenderness.  Extremities:  No edema.  No calf tenderness Skin:  No rash present.   ED Course  Procedures  none   MDM   1. Acute bronchitis, unspecified organism   2. Bronchospasm    Administered Kenalog 40mg  IM Begin amoxicillin 875mg  BID.   Prescription written for Benzonatate [facility]) to take at bedtime for night-time cough.  Take plain guaifenesin (1200mg  extended release tabs such as Mucinex) twice daily, with plenty of water, for cough and congestion.  May add Pseudoephedrine (30mg , one or two every 4 to 6 hours) for sinus congestion.  Get adequate rest.   May use Afrin nasal spray (or generic oxymetazoline) twice daily for about 5 days.  Also recommend using saline nasal spray several times daily and saline nasal irrigation (AYR is a common brand).  Use Flonase nasal spray each morning after using Afrin nasal spray and saline nasal irrigation. Try warm salt water gargles for sore throat. Continue albuterol inhaler as needed.  Stop all antihistamines for now, and other non-prescription cough/cold preparations.  Follow-up with family doctor if not improving about 7 to10 days.     Kandra Nicolas, MD 04/25/15 [phone]

## 2015-05-01 ENCOUNTER — Ambulatory Visit: Payer: PRIVATE HEALTH INSURANCE | Admitting: Family Medicine

## 2015-06-18 ENCOUNTER — Encounter: Payer: Self-pay | Admitting: Family Medicine

## 2015-06-18 ENCOUNTER — Ambulatory Visit (INDEPENDENT_AMBULATORY_CARE_PROVIDER_SITE_OTHER): Payer: Medicare Other | Admitting: Family Medicine

## 2015-06-18 VITALS — BP 124/87 | HR 94 | Wt 193.0 lb

## 2015-06-18 DIAGNOSIS — Z87898 Personal history of other specified conditions: Secondary | ICD-10-CM

## 2015-06-18 DIAGNOSIS — R490 Dysphonia: Secondary | ICD-10-CM | POA: Diagnosis not present

## 2015-06-18 DIAGNOSIS — N400 Enlarged prostate without lower urinary tract symptoms: Secondary | ICD-10-CM | POA: Diagnosis not present

## 2015-06-19 LAB — PSA: PSA: 4.31 ng/mL — ABNORMAL HIGH (ref <=4.00)

## 2015-06-19 NOTE — Progress Notes (Signed)
CC: Jonathan Dawson is a 74 y.o. male is here for losing voice   Subjective: HPI:  Patient reports 3-4 weeks of hoarseness that is absent first thing in the morning and worsens as the day goes on. Symptoms are worse the more he talks. Symptoms of present on the basis mild in severity. He states that he notices it more than others do. He denies any cough, shortness of breath, wheezing, sore throat, nor postnasal drip. He denies any difficulty swallowing, swelling of the neck or unintentional weight loss.  He has a history of an enlarged prostate and mildly elevated PSA, he would like to have his PSA checked today. He denies any difficulty urinating or new joint pain.   Review Of Systems Outlined In HPI  Past Medical History  Diagnosis Date  . Asthma   . Allergy   . Cancer     skin cancer- leg    Past Surgical History  Procedure Laterality Date  . Appendectomy  1981  . Left hand surgery  2011   Family History  Problem Relation Age of Onset  . Diabetes Mother   . Heart failure Father   . Stroke Father   . Alcoholism Father   . Colon cancer Neg Hx   . Esophageal cancer Neg Hx   . Rectal cancer Neg Hx   . Stomach cancer Neg Hx     Social History   Social History  . Marital Status: Married    Spouse Name: N/A  . Number of Children: N/A  . Years of Education: N/A   Occupational History  . Not on file.   Social History Main Topics  . Smoking status: Never Smoker   . Smokeless tobacco: Never Used  . Alcohol Use: Yes     Comment: wine- 2-3 glasses weekly  . Drug Use: No  . Sexual Activity: Yes   Other Topics Concern  . Not on file   Social History Narrative     Objective: BP 124/87 mmHg  Pulse 94  Wt 193 lb (87.544 kg)  General: Alert and Oriented, No Acute Distress HEENT: Pupils equal, round, reactive to light. Conjunctivae clear.  External ears unremarkable, canals clear with intact TMs with appropriate landmarks.  Middle ear appears open without effusion. Pink  inferior turbinates.  Moist mucous membranes, pharynx without inflammation nor lesions.  Neck supple without palpable lymphadenopathy nor abnormal masses. Lungs: Clear to auscultation bilaterally, no wheezing/ronchi/rales.  Comfortable work of breathing. Good air movement. Cardiac: Regular rate and rhythm. Normal S1/S2.  No murmurs, rubs, nor gallops.   Extremities: No peripheral edema.  Strong peripheral pulses.  Mental Status: No depression, anxiety, nor agitation. Skin: Warm and dry.  Assessment & Plan: Willow was seen today for losing voice.  Diagnoses and all orders for this visit:  History of elevated PSA -     PSA  Hoarseness  Enlarged prostate -     PSA   History of elevated PSA: Repeating today Hoarseness: No clear sign of viral or bacterial infection. No signs of allergies. This symptom has coincided with the recent acquisition of his 4 granddaughters who he now has to discipline and care for. He notices that he is talking much more now than he used to and I believe his voice is mearly adjusting to this. Signs and symptoms requring emergent/urgent reevaluation were discussed with the patient.  Return if symptoms worsen or fail to improve.

## 2015-07-16 DIAGNOSIS — R062 Wheezing: Secondary | ICD-10-CM | POA: Diagnosis not present

## 2015-07-16 DIAGNOSIS — B999 Unspecified infectious disease: Secondary | ICD-10-CM | POA: Diagnosis not present

## 2015-07-16 DIAGNOSIS — J310 Chronic rhinitis: Secondary | ICD-10-CM | POA: Diagnosis not present

## 2015-07-16 DIAGNOSIS — J069 Acute upper respiratory infection, unspecified: Secondary | ICD-10-CM | POA: Diagnosis not present

## 2015-07-23 DIAGNOSIS — Z85828 Personal history of other malignant neoplasm of skin: Secondary | ICD-10-CM | POA: Diagnosis not present

## 2015-07-23 DIAGNOSIS — D489 Neoplasm of uncertain behavior, unspecified: Secondary | ICD-10-CM | POA: Diagnosis not present

## 2015-07-23 DIAGNOSIS — D485 Neoplasm of uncertain behavior of skin: Secondary | ICD-10-CM | POA: Diagnosis not present

## 2015-07-23 DIAGNOSIS — C44729 Squamous cell carcinoma of skin of left lower limb, including hip: Secondary | ICD-10-CM | POA: Diagnosis not present

## 2015-07-23 DIAGNOSIS — L570 Actinic keratosis: Secondary | ICD-10-CM | POA: Diagnosis not present

## 2015-08-13 DIAGNOSIS — C44729 Squamous cell carcinoma of skin of left lower limb, including hip: Secondary | ICD-10-CM | POA: Diagnosis not present

## 2015-08-13 DIAGNOSIS — D485 Neoplasm of uncertain behavior of skin: Secondary | ICD-10-CM | POA: Diagnosis not present

## 2015-08-20 DIAGNOSIS — Z85828 Personal history of other malignant neoplasm of skin: Secondary | ICD-10-CM | POA: Diagnosis not present

## 2015-08-20 DIAGNOSIS — C4339 Malignant melanoma of other parts of face: Secondary | ICD-10-CM | POA: Diagnosis not present

## 2015-08-20 DIAGNOSIS — C44629 Squamous cell carcinoma of skin of left upper limb, including shoulder: Secondary | ICD-10-CM | POA: Diagnosis not present

## 2015-08-20 DIAGNOSIS — C7652 Malignant neoplasm of left lower limb: Secondary | ICD-10-CM | POA: Diagnosis not present

## 2015-08-20 DIAGNOSIS — D0462 Carcinoma in situ of skin of left upper limb, including shoulder: Secondary | ICD-10-CM | POA: Diagnosis not present

## 2015-08-20 DIAGNOSIS — D0339 Melanoma in situ of other parts of face: Secondary | ICD-10-CM | POA: Diagnosis not present

## 2015-08-20 DIAGNOSIS — D485 Neoplasm of uncertain behavior of skin: Secondary | ICD-10-CM | POA: Diagnosis not present

## 2015-08-20 DIAGNOSIS — Z7982 Long term (current) use of aspirin: Secondary | ICD-10-CM | POA: Diagnosis not present

## 2015-08-20 DIAGNOSIS — C44729 Squamous cell carcinoma of skin of left lower limb, including hip: Secondary | ICD-10-CM | POA: Diagnosis not present

## 2015-09-08 DIAGNOSIS — H01002 Unspecified blepharitis right lower eyelid: Secondary | ICD-10-CM | POA: Diagnosis not present

## 2015-09-08 DIAGNOSIS — H5213 Myopia, bilateral: Secondary | ICD-10-CM | POA: Diagnosis not present

## 2015-09-08 DIAGNOSIS — H01001 Unspecified blepharitis right upper eyelid: Secondary | ICD-10-CM | POA: Diagnosis not present

## 2015-09-17 ENCOUNTER — Telehealth: Payer: Self-pay

## 2015-09-17 NOTE — Telephone Encounter (Signed)
Pt called requesting amoxicillin.  He stated that he believes he has a URI.  Should this make an appointment first?

## 2015-09-17 NOTE — Telephone Encounter (Signed)
Yes unless a patient has just been seen all antibiotic requests require an in office evaluation.

## 2015-09-17 NOTE — Telephone Encounter (Signed)
Pt advised.

## 2015-09-25 DIAGNOSIS — C44629 Squamous cell carcinoma of skin of left upper limb, including shoulder: Secondary | ICD-10-CM | POA: Diagnosis not present

## 2015-09-25 DIAGNOSIS — D0330 Melanoma in situ of unspecified part of face: Secondary | ICD-10-CM | POA: Diagnosis not present

## 2015-09-25 DIAGNOSIS — C44729 Squamous cell carcinoma of skin of left lower limb, including hip: Secondary | ICD-10-CM | POA: Diagnosis not present

## 2015-10-08 DIAGNOSIS — H15101 Unspecified episcleritis, right eye: Secondary | ICD-10-CM | POA: Diagnosis not present

## 2015-10-20 ENCOUNTER — Other Ambulatory Visit: Payer: Self-pay | Admitting: Family Medicine

## 2015-10-28 DIAGNOSIS — H04123 Dry eye syndrome of bilateral lacrimal glands: Secondary | ICD-10-CM | POA: Diagnosis not present

## 2015-10-28 DIAGNOSIS — H268 Other specified cataract: Secondary | ICD-10-CM | POA: Diagnosis not present

## 2015-10-28 DIAGNOSIS — H15101 Unspecified episcleritis, right eye: Secondary | ICD-10-CM | POA: Diagnosis not present

## 2015-10-29 DIAGNOSIS — C44729 Squamous cell carcinoma of skin of left lower limb, including hip: Secondary | ICD-10-CM | POA: Diagnosis not present

## 2015-11-05 DIAGNOSIS — R3915 Urgency of urination: Secondary | ICD-10-CM | POA: Diagnosis not present

## 2015-11-05 DIAGNOSIS — N529 Male erectile dysfunction, unspecified: Secondary | ICD-10-CM | POA: Diagnosis not present

## 2015-11-05 DIAGNOSIS — R972 Elevated prostate specific antigen [PSA]: Secondary | ICD-10-CM | POA: Diagnosis not present

## 2015-11-17 DIAGNOSIS — N400 Enlarged prostate without lower urinary tract symptoms: Secondary | ICD-10-CM | POA: Diagnosis not present

## 2015-11-17 DIAGNOSIS — N319 Neuromuscular dysfunction of bladder, unspecified: Secondary | ICD-10-CM | POA: Diagnosis not present

## 2015-11-17 DIAGNOSIS — N401 Enlarged prostate with lower urinary tract symptoms: Secondary | ICD-10-CM | POA: Diagnosis not present

## 2015-11-17 DIAGNOSIS — R972 Elevated prostate specific antigen [PSA]: Secondary | ICD-10-CM | POA: Diagnosis not present

## 2015-11-17 DIAGNOSIS — N281 Cyst of kidney, acquired: Secondary | ICD-10-CM | POA: Diagnosis not present

## 2015-11-27 DIAGNOSIS — C801 Malignant (primary) neoplasm, unspecified: Secondary | ICD-10-CM | POA: Diagnosis not present

## 2015-11-27 DIAGNOSIS — C44722 Squamous cell carcinoma of skin of right lower limb, including hip: Secondary | ICD-10-CM | POA: Diagnosis not present

## 2015-11-27 DIAGNOSIS — Z880 Allergy status to penicillin: Secondary | ICD-10-CM | POA: Diagnosis not present

## 2015-11-27 DIAGNOSIS — Z85828 Personal history of other malignant neoplasm of skin: Secondary | ICD-10-CM | POA: Diagnosis not present

## 2015-11-27 DIAGNOSIS — D485 Neoplasm of uncertain behavior of skin: Secondary | ICD-10-CM | POA: Diagnosis not present

## 2015-12-24 DIAGNOSIS — R972 Elevated prostate specific antigen [PSA]: Secondary | ICD-10-CM | POA: Diagnosis not present

## 2015-12-24 DIAGNOSIS — N529 Male erectile dysfunction, unspecified: Secondary | ICD-10-CM | POA: Diagnosis not present

## 2015-12-24 DIAGNOSIS — R3915 Urgency of urination: Secondary | ICD-10-CM | POA: Diagnosis not present

## 2015-12-24 DIAGNOSIS — N281 Cyst of kidney, acquired: Secondary | ICD-10-CM | POA: Diagnosis not present

## 2015-12-30 ENCOUNTER — Ambulatory Visit (INDEPENDENT_AMBULATORY_CARE_PROVIDER_SITE_OTHER): Payer: Medicare Other | Admitting: Family Medicine

## 2015-12-30 ENCOUNTER — Encounter: Payer: Self-pay | Admitting: Family Medicine

## 2015-12-30 VITALS — BP 135/84 | HR 85 | Temp 98.1°F | Wt 197.0 lb

## 2015-12-30 DIAGNOSIS — Z87898 Personal history of other specified conditions: Secondary | ICD-10-CM | POA: Diagnosis not present

## 2015-12-30 DIAGNOSIS — C439 Malignant melanoma of skin, unspecified: Secondary | ICD-10-CM | POA: Diagnosis not present

## 2015-12-30 DIAGNOSIS — R0982 Postnasal drip: Secondary | ICD-10-CM | POA: Diagnosis not present

## 2015-12-30 MED ORDER — DOXYCYCLINE HYCLATE 100 MG PO TABS: ORAL_TABLET | ORAL | Status: AC

## 2015-12-30 NOTE — Progress Notes (Signed)
CC: Jonathan Dawson is a 75 y.o. male is here for Fatigue; Cough; Headache; and Chills   Subjective: HPI:  Nonproductive cough, pressure in the sinuses subjective chills and nasal congestion that's been going on for the last week. Present on a daily basis. Seems to slowly be worsening. Worse in dusty and humid areas. No benefit from over-the-counter cough and cold medications nor Flonase. Denies wheezing, shortness of breath or chest discomfort.  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 He's also having to take care of his grandchildren ever since his son lost his children due to drugs and alcohol. Thankfully both parents are now through with rehabilitation.   Review Of Systems Outlined In HPI  Past Medical History  Diagnosis Date  . Asthma   . Allergy   . Cancer ([place])     skin cancer- leg    Past Surgical History  Procedure Laterality Date  . Appendectomy  1981  . Left hand surgery  2011   Family History  Problem Relation Age of Onset  . Diabetes Mother   . Heart failure Father   . Stroke Father   . Alcoholism Father   . Colon cancer Neg Hx   . Esophageal cancer Neg Hx   . Rectal cancer Neg Hx   . Stomach cancer Neg Hx     Social History   Social History  . Marital Status: Married    Spouse Name: N/A  . Number of Children: N/A  . Years of Education: N/A   Occupational History  . Not on file.   Social History Main Topics  . Smoking status: Never Smoker   . Smokeless tobacco: Never Used  . Alcohol Use: Yes     Comment: wine- 2-3 glasses weekly  . Drug Use: No  . Sexual Activity: Yes   Other Topics Concern  . Not on file   Social History Narrative     Objective: BP 135/84 mmHg  Pulse 85  Temp(Src) 98.1 F (36.7 C) (Oral)  Wt 197 lb (89.359 kg)  SpO2 93%  General: Alert and Oriented, No Acute Distress HEENT: Pupils equal, round, reactive to light. Conjunctivae clear.  External ears unremarkable,  canals clear with intact TMs with appropriate landmarks.  Middle ear appears open without effusion. Pink inferior turbinates.  Moist mucous membranes, pharynx without inflammation nor lesions.  Neck supple without palpable lymphadenopathy nor abnormal masses. Lungs: Clear to auscultation bilaterally, no wheezing/ronchi/rales.  Comfortable work of breathing. Good air movement. Cardiac: Regular rate and rhythm. Normal S1/S2.  No murmurs, rubs, nor gallops.   Extremities: No peripheral edema.  Strong peripheral pulses.  Mental Status: No depression, anxiety, nor agitation. Skin: Warm and dry.  Assessment & Plan: Kyier was seen today for fatigue, cough, headache and chills.  Diagnoses and all orders for this visit:  Melanoma of skin ([place])  History of elevated PSA  Postnasal drip -     doxycycline (VIBRA-TABS) 100 MG tablet; One by mouth twice a day for ten days.  Postnasal drip due to sinusitis therefore start doxycycline. Encouraged to use nasal saline washes.  25 minutes spent face-to-face during visit today of which at least 50% was counseling or coordinating care regarding: 1. Melanoma of skin ([place])   2. History of elevated PSA   3. Postnasal drip      Return if symptoms worsen or fail to improve.

## 2015-12-31 ENCOUNTER — Telehealth: Payer: Self-pay

## 2015-12-31 NOTE — Telephone Encounter (Signed)
Yes due to an intolerance to penicillin in the past which could also cause an intolerance to amoxicillin one day.

## 2015-12-31 NOTE — Telephone Encounter (Signed)
Pt was seen yesterday and was Rx doxycycline.  He wants to make sure this is the medication that you want him to be on considering in the past he was amoxicillin. Please advise.

## 2015-12-31 NOTE — Telephone Encounter (Signed)
Pt.notified

## 2016-01-27 DIAGNOSIS — Z85828 Personal history of other malignant neoplasm of skin: Secondary | ICD-10-CM | POA: Diagnosis not present

## 2016-01-27 DIAGNOSIS — C44629 Squamous cell carcinoma of skin of left upper limb, including shoulder: Secondary | ICD-10-CM | POA: Diagnosis not present

## 2016-01-27 DIAGNOSIS — D0462 Carcinoma in situ of skin of left upper limb, including shoulder: Secondary | ICD-10-CM | POA: Diagnosis not present

## 2016-01-27 DIAGNOSIS — D0339 Melanoma in situ of other parts of face: Secondary | ICD-10-CM | POA: Diagnosis not present

## 2016-01-27 DIAGNOSIS — L738 Other specified follicular disorders: Secondary | ICD-10-CM | POA: Diagnosis not present

## 2016-01-27 DIAGNOSIS — D0330 Melanoma in situ of unspecified part of face: Secondary | ICD-10-CM | POA: Diagnosis not present

## 2016-02-10 DIAGNOSIS — C44629 Squamous cell carcinoma of skin of left upper limb, including shoulder: Secondary | ICD-10-CM | POA: Diagnosis not present

## 2016-02-10 DIAGNOSIS — L570 Actinic keratosis: Secondary | ICD-10-CM | POA: Diagnosis not present

## 2016-02-10 DIAGNOSIS — D0462 Carcinoma in situ of skin of left upper limb, including shoulder: Secondary | ICD-10-CM | POA: Diagnosis not present

## 2016-03-30 DIAGNOSIS — M5417 Radiculopathy, lumbosacral region: Secondary | ICD-10-CM | POA: Diagnosis not present

## 2016-03-30 DIAGNOSIS — M9903 Segmental and somatic dysfunction of lumbar region: Secondary | ICD-10-CM | POA: Diagnosis not present

## 2016-03-30 DIAGNOSIS — M9905 Segmental and somatic dysfunction of pelvic region: Secondary | ICD-10-CM | POA: Diagnosis not present

## 2016-03-30 DIAGNOSIS — M9904 Segmental and somatic dysfunction of sacral region: Secondary | ICD-10-CM | POA: Diagnosis not present

## 2016-03-30 DIAGNOSIS — M5137 Other intervertebral disc degeneration, lumbosacral region: Secondary | ICD-10-CM | POA: Diagnosis not present

## 2016-03-31 DIAGNOSIS — M9904 Segmental and somatic dysfunction of sacral region: Secondary | ICD-10-CM | POA: Diagnosis not present

## 2016-03-31 DIAGNOSIS — M5137 Other intervertebral disc degeneration, lumbosacral region: Secondary | ICD-10-CM | POA: Diagnosis not present

## 2016-03-31 DIAGNOSIS — M5417 Radiculopathy, lumbosacral region: Secondary | ICD-10-CM | POA: Diagnosis not present

## 2016-03-31 DIAGNOSIS — M9903 Segmental and somatic dysfunction of lumbar region: Secondary | ICD-10-CM | POA: Diagnosis not present

## 2016-03-31 DIAGNOSIS — M9905 Segmental and somatic dysfunction of pelvic region: Secondary | ICD-10-CM | POA: Diagnosis not present

## 2016-04-06 DIAGNOSIS — M5137 Other intervertebral disc degeneration, lumbosacral region: Secondary | ICD-10-CM | POA: Diagnosis not present

## 2016-04-06 DIAGNOSIS — M9904 Segmental and somatic dysfunction of sacral region: Secondary | ICD-10-CM | POA: Diagnosis not present

## 2016-04-06 DIAGNOSIS — M9905 Segmental and somatic dysfunction of pelvic region: Secondary | ICD-10-CM | POA: Diagnosis not present

## 2016-04-06 DIAGNOSIS — M9903 Segmental and somatic dysfunction of lumbar region: Secondary | ICD-10-CM | POA: Diagnosis not present

## 2016-04-06 DIAGNOSIS — M5417 Radiculopathy, lumbosacral region: Secondary | ICD-10-CM | POA: Diagnosis not present

## 2016-04-12 ENCOUNTER — Encounter: Payer: Self-pay | Admitting: Family Medicine

## 2016-04-12 DIAGNOSIS — M545 Low back pain, unspecified: Secondary | ICD-10-CM | POA: Insufficient documentation

## 2016-04-14 DIAGNOSIS — M9901 Segmental and somatic dysfunction of cervical region: Secondary | ICD-10-CM | POA: Diagnosis not present

## 2016-04-14 DIAGNOSIS — M9905 Segmental and somatic dysfunction of pelvic region: Secondary | ICD-10-CM | POA: Diagnosis not present

## 2016-04-14 DIAGNOSIS — M531 Cervicobrachial syndrome: Secondary | ICD-10-CM | POA: Diagnosis not present

## 2016-04-14 DIAGNOSIS — M5137 Other intervertebral disc degeneration, lumbosacral region: Secondary | ICD-10-CM | POA: Diagnosis not present

## 2016-04-14 DIAGNOSIS — M9904 Segmental and somatic dysfunction of sacral region: Secondary | ICD-10-CM | POA: Diagnosis not present

## 2016-04-14 DIAGNOSIS — M5417 Radiculopathy, lumbosacral region: Secondary | ICD-10-CM | POA: Diagnosis not present

## 2016-04-14 DIAGNOSIS — M9903 Segmental and somatic dysfunction of lumbar region: Secondary | ICD-10-CM | POA: Diagnosis not present

## 2016-04-14 DIAGNOSIS — M50322 Other cervical disc degeneration at C5-C6 level: Secondary | ICD-10-CM | POA: Diagnosis not present

## 2016-04-15 DIAGNOSIS — M5417 Radiculopathy, lumbosacral region: Secondary | ICD-10-CM | POA: Diagnosis not present

## 2016-04-15 DIAGNOSIS — D485 Neoplasm of uncertain behavior of skin: Secondary | ICD-10-CM | POA: Diagnosis not present

## 2016-04-15 DIAGNOSIS — C44729 Squamous cell carcinoma of skin of left lower limb, including hip: Secondary | ICD-10-CM | POA: Diagnosis not present

## 2016-04-15 DIAGNOSIS — M531 Cervicobrachial syndrome: Secondary | ICD-10-CM | POA: Diagnosis not present

## 2016-04-15 DIAGNOSIS — M50322 Other cervical disc degeneration at C5-C6 level: Secondary | ICD-10-CM | POA: Diagnosis not present

## 2016-04-15 DIAGNOSIS — Z85828 Personal history of other malignant neoplasm of skin: Secondary | ICD-10-CM | POA: Diagnosis not present

## 2016-04-15 DIAGNOSIS — C44622 Squamous cell carcinoma of skin of right upper limb, including shoulder: Secondary | ICD-10-CM | POA: Diagnosis not present

## 2016-04-15 DIAGNOSIS — M9905 Segmental and somatic dysfunction of pelvic region: Secondary | ICD-10-CM | POA: Diagnosis not present

## 2016-04-15 DIAGNOSIS — Z87898 Personal history of other specified conditions: Secondary | ICD-10-CM | POA: Diagnosis not present

## 2016-04-15 DIAGNOSIS — Z85820 Personal history of malignant melanoma of skin: Secondary | ICD-10-CM | POA: Diagnosis not present

## 2016-04-15 DIAGNOSIS — L570 Actinic keratosis: Secondary | ICD-10-CM | POA: Diagnosis not present

## 2016-04-15 DIAGNOSIS — M9901 Segmental and somatic dysfunction of cervical region: Secondary | ICD-10-CM | POA: Diagnosis not present

## 2016-04-15 DIAGNOSIS — C44722 Squamous cell carcinoma of skin of right lower limb, including hip: Secondary | ICD-10-CM | POA: Diagnosis not present

## 2016-04-15 DIAGNOSIS — L821 Other seborrheic keratosis: Secondary | ICD-10-CM | POA: Diagnosis not present

## 2016-04-15 DIAGNOSIS — M9904 Segmental and somatic dysfunction of sacral region: Secondary | ICD-10-CM | POA: Diagnosis not present

## 2016-04-15 DIAGNOSIS — M9903 Segmental and somatic dysfunction of lumbar region: Secondary | ICD-10-CM | POA: Diagnosis not present

## 2016-04-15 DIAGNOSIS — M5137 Other intervertebral disc degeneration, lumbosacral region: Secondary | ICD-10-CM | POA: Diagnosis not present

## 2016-04-16 DIAGNOSIS — M531 Cervicobrachial syndrome: Secondary | ICD-10-CM | POA: Diagnosis not present

## 2016-04-16 DIAGNOSIS — M50322 Other cervical disc degeneration at C5-C6 level: Secondary | ICD-10-CM | POA: Diagnosis not present

## 2016-04-16 DIAGNOSIS — M9901 Segmental and somatic dysfunction of cervical region: Secondary | ICD-10-CM | POA: Diagnosis not present

## 2016-04-16 DIAGNOSIS — M9903 Segmental and somatic dysfunction of lumbar region: Secondary | ICD-10-CM | POA: Diagnosis not present

## 2016-04-16 DIAGNOSIS — M9904 Segmental and somatic dysfunction of sacral region: Secondary | ICD-10-CM | POA: Diagnosis not present

## 2016-04-16 DIAGNOSIS — M5417 Radiculopathy, lumbosacral region: Secondary | ICD-10-CM | POA: Diagnosis not present

## 2016-04-16 DIAGNOSIS — M9905 Segmental and somatic dysfunction of pelvic region: Secondary | ICD-10-CM | POA: Diagnosis not present

## 2016-04-16 DIAGNOSIS — M5137 Other intervertebral disc degeneration, lumbosacral region: Secondary | ICD-10-CM | POA: Diagnosis not present

## 2016-04-19 DIAGNOSIS — M50322 Other cervical disc degeneration at C5-C6 level: Secondary | ICD-10-CM | POA: Diagnosis not present

## 2016-04-19 DIAGNOSIS — M5417 Radiculopathy, lumbosacral region: Secondary | ICD-10-CM | POA: Diagnosis not present

## 2016-04-19 DIAGNOSIS — M9901 Segmental and somatic dysfunction of cervical region: Secondary | ICD-10-CM | POA: Diagnosis not present

## 2016-04-19 DIAGNOSIS — M531 Cervicobrachial syndrome: Secondary | ICD-10-CM | POA: Diagnosis not present

## 2016-04-19 DIAGNOSIS — M9904 Segmental and somatic dysfunction of sacral region: Secondary | ICD-10-CM | POA: Diagnosis not present

## 2016-04-19 DIAGNOSIS — M9903 Segmental and somatic dysfunction of lumbar region: Secondary | ICD-10-CM | POA: Diagnosis not present

## 2016-04-19 DIAGNOSIS — M5137 Other intervertebral disc degeneration, lumbosacral region: Secondary | ICD-10-CM | POA: Diagnosis not present

## 2016-04-19 DIAGNOSIS — M9905 Segmental and somatic dysfunction of pelvic region: Secondary | ICD-10-CM | POA: Diagnosis not present

## 2016-04-22 DIAGNOSIS — M9901 Segmental and somatic dysfunction of cervical region: Secondary | ICD-10-CM | POA: Diagnosis not present

## 2016-04-22 DIAGNOSIS — M9903 Segmental and somatic dysfunction of lumbar region: Secondary | ICD-10-CM | POA: Diagnosis not present

## 2016-04-22 DIAGNOSIS — M5137 Other intervertebral disc degeneration, lumbosacral region: Secondary | ICD-10-CM | POA: Diagnosis not present

## 2016-04-22 DIAGNOSIS — M531 Cervicobrachial syndrome: Secondary | ICD-10-CM | POA: Diagnosis not present

## 2016-04-22 DIAGNOSIS — M5417 Radiculopathy, lumbosacral region: Secondary | ICD-10-CM | POA: Diagnosis not present

## 2016-04-22 DIAGNOSIS — M9904 Segmental and somatic dysfunction of sacral region: Secondary | ICD-10-CM | POA: Diagnosis not present

## 2016-04-22 DIAGNOSIS — M50322 Other cervical disc degeneration at C5-C6 level: Secondary | ICD-10-CM | POA: Diagnosis not present

## 2016-04-22 DIAGNOSIS — M9905 Segmental and somatic dysfunction of pelvic region: Secondary | ICD-10-CM | POA: Diagnosis not present

## 2016-05-06 DIAGNOSIS — M50322 Other cervical disc degeneration at C5-C6 level: Secondary | ICD-10-CM | POA: Diagnosis not present

## 2016-05-06 DIAGNOSIS — M5137 Other intervertebral disc degeneration, lumbosacral region: Secondary | ICD-10-CM | POA: Diagnosis not present

## 2016-05-06 DIAGNOSIS — M5417 Radiculopathy, lumbosacral region: Secondary | ICD-10-CM | POA: Diagnosis not present

## 2016-05-06 DIAGNOSIS — M9903 Segmental and somatic dysfunction of lumbar region: Secondary | ICD-10-CM | POA: Diagnosis not present

## 2016-05-06 DIAGNOSIS — M9905 Segmental and somatic dysfunction of pelvic region: Secondary | ICD-10-CM | POA: Diagnosis not present

## 2016-05-06 DIAGNOSIS — M9901 Segmental and somatic dysfunction of cervical region: Secondary | ICD-10-CM | POA: Diagnosis not present

## 2016-05-06 DIAGNOSIS — M531 Cervicobrachial syndrome: Secondary | ICD-10-CM | POA: Diagnosis not present

## 2016-05-06 DIAGNOSIS — M9904 Segmental and somatic dysfunction of sacral region: Secondary | ICD-10-CM | POA: Diagnosis not present

## 2016-05-13 DIAGNOSIS — M9905 Segmental and somatic dysfunction of pelvic region: Secondary | ICD-10-CM | POA: Diagnosis not present

## 2016-05-13 DIAGNOSIS — M50322 Other cervical disc degeneration at C5-C6 level: Secondary | ICD-10-CM | POA: Diagnosis not present

## 2016-05-13 DIAGNOSIS — M9904 Segmental and somatic dysfunction of sacral region: Secondary | ICD-10-CM | POA: Diagnosis not present

## 2016-05-13 DIAGNOSIS — M5417 Radiculopathy, lumbosacral region: Secondary | ICD-10-CM | POA: Diagnosis not present

## 2016-05-13 DIAGNOSIS — M9901 Segmental and somatic dysfunction of cervical region: Secondary | ICD-10-CM | POA: Diagnosis not present

## 2016-05-13 DIAGNOSIS — M5137 Other intervertebral disc degeneration, lumbosacral region: Secondary | ICD-10-CM | POA: Diagnosis not present

## 2016-05-13 DIAGNOSIS — M531 Cervicobrachial syndrome: Secondary | ICD-10-CM | POA: Diagnosis not present

## 2016-05-13 DIAGNOSIS — M9903 Segmental and somatic dysfunction of lumbar region: Secondary | ICD-10-CM | POA: Diagnosis not present

## 2016-06-08 DIAGNOSIS — C44729 Squamous cell carcinoma of skin of left lower limb, including hip: Secondary | ICD-10-CM | POA: Diagnosis not present

## 2016-06-08 DIAGNOSIS — Z85828 Personal history of other malignant neoplasm of skin: Secondary | ICD-10-CM | POA: Diagnosis not present

## 2016-06-08 DIAGNOSIS — D489 Neoplasm of uncertain behavior, unspecified: Secondary | ICD-10-CM | POA: Diagnosis not present

## 2016-06-08 DIAGNOSIS — C44722 Squamous cell carcinoma of skin of right lower limb, including hip: Secondary | ICD-10-CM | POA: Diagnosis not present

## 2016-06-08 DIAGNOSIS — C44622 Squamous cell carcinoma of skin of right upper limb, including shoulder: Secondary | ICD-10-CM | POA: Diagnosis not present

## 2016-06-08 DIAGNOSIS — C4339 Malignant melanoma of other parts of face: Secondary | ICD-10-CM | POA: Diagnosis not present

## 2016-07-13 DIAGNOSIS — M9901 Segmental and somatic dysfunction of cervical region: Secondary | ICD-10-CM | POA: Diagnosis not present

## 2016-07-13 DIAGNOSIS — M9905 Segmental and somatic dysfunction of pelvic region: Secondary | ICD-10-CM | POA: Diagnosis not present

## 2016-07-13 DIAGNOSIS — M531 Cervicobrachial syndrome: Secondary | ICD-10-CM | POA: Diagnosis not present

## 2016-07-13 DIAGNOSIS — M9904 Segmental and somatic dysfunction of sacral region: Secondary | ICD-10-CM | POA: Diagnosis not present

## 2016-07-13 DIAGNOSIS — M50322 Other cervical disc degeneration at C5-C6 level: Secondary | ICD-10-CM | POA: Diagnosis not present

## 2016-07-13 DIAGNOSIS — M9903 Segmental and somatic dysfunction of lumbar region: Secondary | ICD-10-CM | POA: Diagnosis not present

## 2016-07-13 DIAGNOSIS — M5137 Other intervertebral disc degeneration, lumbosacral region: Secondary | ICD-10-CM | POA: Diagnosis not present

## 2016-07-13 DIAGNOSIS — M5417 Radiculopathy, lumbosacral region: Secondary | ICD-10-CM | POA: Diagnosis not present

## 2016-07-19 DIAGNOSIS — M50322 Other cervical disc degeneration at C5-C6 level: Secondary | ICD-10-CM | POA: Diagnosis not present

## 2016-07-19 DIAGNOSIS — M5137 Other intervertebral disc degeneration, lumbosacral region: Secondary | ICD-10-CM | POA: Diagnosis not present

## 2016-07-19 DIAGNOSIS — M5417 Radiculopathy, lumbosacral region: Secondary | ICD-10-CM | POA: Diagnosis not present

## 2016-07-19 DIAGNOSIS — M9904 Segmental and somatic dysfunction of sacral region: Secondary | ICD-10-CM | POA: Diagnosis not present

## 2016-07-19 DIAGNOSIS — M9901 Segmental and somatic dysfunction of cervical region: Secondary | ICD-10-CM | POA: Diagnosis not present

## 2016-07-19 DIAGNOSIS — M9905 Segmental and somatic dysfunction of pelvic region: Secondary | ICD-10-CM | POA: Diagnosis not present

## 2016-07-19 DIAGNOSIS — M531 Cervicobrachial syndrome: Secondary | ICD-10-CM | POA: Diagnosis not present

## 2016-07-19 DIAGNOSIS — M9903 Segmental and somatic dysfunction of lumbar region: Secondary | ICD-10-CM | POA: Diagnosis not present

## 2016-07-21 DIAGNOSIS — M5417 Radiculopathy, lumbosacral region: Secondary | ICD-10-CM | POA: Diagnosis not present

## 2016-07-21 DIAGNOSIS — M9901 Segmental and somatic dysfunction of cervical region: Secondary | ICD-10-CM | POA: Diagnosis not present

## 2016-07-21 DIAGNOSIS — M9904 Segmental and somatic dysfunction of sacral region: Secondary | ICD-10-CM | POA: Diagnosis not present

## 2016-07-21 DIAGNOSIS — M50322 Other cervical disc degeneration at C5-C6 level: Secondary | ICD-10-CM | POA: Diagnosis not present

## 2016-07-21 DIAGNOSIS — M5137 Other intervertebral disc degeneration, lumbosacral region: Secondary | ICD-10-CM | POA: Diagnosis not present

## 2016-07-21 DIAGNOSIS — M9905 Segmental and somatic dysfunction of pelvic region: Secondary | ICD-10-CM | POA: Diagnosis not present

## 2016-07-21 DIAGNOSIS — M9903 Segmental and somatic dysfunction of lumbar region: Secondary | ICD-10-CM | POA: Diagnosis not present

## 2016-07-21 DIAGNOSIS — M531 Cervicobrachial syndrome: Secondary | ICD-10-CM | POA: Diagnosis not present

## 2016-07-22 DIAGNOSIS — C44622 Squamous cell carcinoma of skin of right upper limb, including shoulder: Secondary | ICD-10-CM | POA: Diagnosis not present

## 2016-07-22 DIAGNOSIS — C44729 Squamous cell carcinoma of skin of left lower limb, including hip: Secondary | ICD-10-CM | POA: Diagnosis not present

## 2016-07-22 DIAGNOSIS — L570 Actinic keratosis: Secondary | ICD-10-CM | POA: Diagnosis not present

## 2016-07-22 DIAGNOSIS — D485 Neoplasm of uncertain behavior of skin: Secondary | ICD-10-CM | POA: Diagnosis not present

## 2016-08-16 DIAGNOSIS — C44722 Squamous cell carcinoma of skin of right lower limb, including hip: Secondary | ICD-10-CM | POA: Diagnosis not present

## 2016-08-16 DIAGNOSIS — D489 Neoplasm of uncertain behavior, unspecified: Secondary | ICD-10-CM | POA: Diagnosis not present

## 2016-08-16 DIAGNOSIS — L570 Actinic keratosis: Secondary | ICD-10-CM | POA: Diagnosis not present

## 2016-08-16 DIAGNOSIS — Z85820 Personal history of malignant melanoma of skin: Secondary | ICD-10-CM | POA: Diagnosis not present

## 2016-08-16 DIAGNOSIS — Z85828 Personal history of other malignant neoplasm of skin: Secondary | ICD-10-CM | POA: Diagnosis not present

## 2016-08-23 DIAGNOSIS — M9903 Segmental and somatic dysfunction of lumbar region: Secondary | ICD-10-CM | POA: Diagnosis not present

## 2016-08-23 DIAGNOSIS — M5417 Radiculopathy, lumbosacral region: Secondary | ICD-10-CM | POA: Diagnosis not present

## 2016-08-23 DIAGNOSIS — M9901 Segmental and somatic dysfunction of cervical region: Secondary | ICD-10-CM | POA: Diagnosis not present

## 2016-08-23 DIAGNOSIS — M50322 Other cervical disc degeneration at C5-C6 level: Secondary | ICD-10-CM | POA: Diagnosis not present

## 2016-08-23 DIAGNOSIS — M9904 Segmental and somatic dysfunction of sacral region: Secondary | ICD-10-CM | POA: Diagnosis not present

## 2016-08-23 DIAGNOSIS — M5137 Other intervertebral disc degeneration, lumbosacral region: Secondary | ICD-10-CM | POA: Diagnosis not present

## 2016-08-23 DIAGNOSIS — M9905 Segmental and somatic dysfunction of pelvic region: Secondary | ICD-10-CM | POA: Diagnosis not present

## 2016-08-23 DIAGNOSIS — M531 Cervicobrachial syndrome: Secondary | ICD-10-CM | POA: Diagnosis not present

## 2016-09-21 DIAGNOSIS — C44622 Squamous cell carcinoma of skin of right upper limb, including shoulder: Secondary | ICD-10-CM | POA: Diagnosis not present

## 2016-09-21 DIAGNOSIS — Z85828 Personal history of other malignant neoplasm of skin: Secondary | ICD-10-CM | POA: Diagnosis not present

## 2016-09-21 DIAGNOSIS — C44529 Squamous cell carcinoma of skin of other part of trunk: Secondary | ICD-10-CM | POA: Diagnosis not present

## 2016-09-21 DIAGNOSIS — L570 Actinic keratosis: Secondary | ICD-10-CM | POA: Diagnosis not present

## 2016-09-21 DIAGNOSIS — C44722 Squamous cell carcinoma of skin of right lower limb, including hip: Secondary | ICD-10-CM | POA: Diagnosis not present

## 2016-10-01 ENCOUNTER — Other Ambulatory Visit: Payer: Self-pay

## 2016-11-02 DIAGNOSIS — D485 Neoplasm of uncertain behavior of skin: Secondary | ICD-10-CM | POA: Diagnosis not present

## 2016-11-02 DIAGNOSIS — C44729 Squamous cell carcinoma of skin of left lower limb, including hip: Secondary | ICD-10-CM | POA: Diagnosis not present

## 2016-11-02 DIAGNOSIS — L570 Actinic keratosis: Secondary | ICD-10-CM | POA: Diagnosis not present

## 2017-02-16 DIAGNOSIS — R740 Nonspecific elevation of levels of transaminase and lactic acid dehydrogenase [LDH]: Secondary | ICD-10-CM | POA: Diagnosis not present

## 2017-02-16 DIAGNOSIS — K8020 Calculus of gallbladder without cholecystitis without obstruction: Secondary | ICD-10-CM | POA: Diagnosis not present

## 2017-02-16 DIAGNOSIS — R1011 Right upper quadrant pain: Secondary | ICD-10-CM | POA: Diagnosis not present

## 2017-02-17 ENCOUNTER — Other Ambulatory Visit: Payer: Self-pay | Admitting: Gastroenterology

## 2017-02-17 DIAGNOSIS — L578 Other skin changes due to chronic exposure to nonionizing radiation: Secondary | ICD-10-CM | POA: Diagnosis not present

## 2017-02-17 DIAGNOSIS — Z85820 Personal history of malignant melanoma of skin: Secondary | ICD-10-CM | POA: Diagnosis not present

## 2017-02-17 DIAGNOSIS — D229 Melanocytic nevi, unspecified: Secondary | ICD-10-CM | POA: Diagnosis not present

## 2017-02-17 DIAGNOSIS — L812 Freckles: Secondary | ICD-10-CM | POA: Diagnosis not present

## 2017-02-17 DIAGNOSIS — C44722 Squamous cell carcinoma of skin of right lower limb, including hip: Secondary | ICD-10-CM | POA: Diagnosis not present

## 2017-02-17 DIAGNOSIS — Z1283 Encounter for screening for malignant neoplasm of skin: Secondary | ICD-10-CM | POA: Diagnosis not present

## 2017-02-17 DIAGNOSIS — D485 Neoplasm of uncertain behavior of skin: Secondary | ICD-10-CM | POA: Diagnosis not present

## 2017-02-17 DIAGNOSIS — L821 Other seborrheic keratosis: Secondary | ICD-10-CM | POA: Diagnosis not present

## 2017-02-17 DIAGNOSIS — C44729 Squamous cell carcinoma of skin of left lower limb, including hip: Secondary | ICD-10-CM | POA: Diagnosis not present

## 2017-02-17 DIAGNOSIS — Z85828 Personal history of other malignant neoplasm of skin: Secondary | ICD-10-CM | POA: Diagnosis not present

## 2017-02-17 DIAGNOSIS — K8020 Calculus of gallbladder without cholecystitis without obstruction: Secondary | ICD-10-CM

## 2017-03-02 ENCOUNTER — Ambulatory Visit
Admission: RE | Admit: 2017-03-02 | Discharge: 2017-03-02 | Disposition: A | Discharge status: Home / Self Care | Payer: Medicare Other | Source: Ambulatory Visit | Attending: Gastroenterology | Admitting: Gastroenterology

## 2017-03-02 ENCOUNTER — Other Ambulatory Visit: Payer: Self-pay | Admitting: Gastroenterology

## 2017-03-02 ENCOUNTER — Other Ambulatory Visit: Payer: PRIVATE HEALTH INSURANCE

## 2017-03-02 DIAGNOSIS — K8020 Calculus of gallbladder without cholecystitis without obstruction: Secondary | ICD-10-CM

## 2017-03-02 DIAGNOSIS — C44729 Squamous cell carcinoma of skin of left lower limb, including hip: Secondary | ICD-10-CM | POA: Diagnosis not present

## 2017-03-02 DIAGNOSIS — D3501 Benign neoplasm of right adrenal gland: Secondary | ICD-10-CM | POA: Diagnosis not present

## 2017-03-02 DIAGNOSIS — Z77018 Contact with and (suspected) exposure to other hazardous metals: Secondary | ICD-10-CM

## 2017-03-02 DIAGNOSIS — Z135 Encounter for screening for eye and ear disorders: Secondary | ICD-10-CM | POA: Diagnosis not present

## 2017-03-02 DIAGNOSIS — C44722 Squamous cell carcinoma of skin of right lower limb, including hip: Secondary | ICD-10-CM | POA: Diagnosis not present

## 2017-03-02 MED ORDER — GADOBENATE DIMEGLUMINE 529 MG/ML IV SOLN
18.0000 mL | Freq: Once | INTRAVENOUS | Status: AC | PRN
  Administered 2017-03-02: 18 mL via INTRAVENOUS

## 2017-03-14 DIAGNOSIS — K8020 Calculus of gallbladder without cholecystitis without obstruction: Secondary | ICD-10-CM | POA: Diagnosis not present

## 2017-03-14 DIAGNOSIS — K831 Obstruction of bile duct: Secondary | ICD-10-CM | POA: Diagnosis not present

## 2017-03-14 DIAGNOSIS — R740 Nonspecific elevation of levels of transaminase and lactic acid dehydrogenase [LDH]: Secondary | ICD-10-CM | POA: Diagnosis not present

## 2017-03-14 DIAGNOSIS — K862 Cyst of pancreas: Secondary | ICD-10-CM | POA: Diagnosis not present

## 2017-03-15 ENCOUNTER — Other Ambulatory Visit: Payer: Self-pay | Admitting: Gastroenterology

## 2017-03-28 ENCOUNTER — Encounter (HOSPITAL_COMMUNITY)
Admission: RE | Disposition: A | Discharge status: Home / Self Care | Payer: Self-pay | Source: Ambulatory Visit | Attending: Gastroenterology

## 2017-03-28 ENCOUNTER — Encounter (HOSPITAL_COMMUNITY): Payer: Self-pay | Admitting: Certified Registered"

## 2017-03-28 ENCOUNTER — Ambulatory Visit (HOSPITAL_COMMUNITY): Payer: Medicare Other

## 2017-03-28 ENCOUNTER — Ambulatory Visit (HOSPITAL_COMMUNITY)
Admission: RE | Admit: 2017-03-28 | Discharge: 2017-03-28 | Disposition: A | Discharge status: Home / Self Care | Payer: Medicare Other | Source: Ambulatory Visit | Attending: Gastroenterology | Admitting: Gastroenterology

## 2017-03-28 ENCOUNTER — Ambulatory Visit (HOSPITAL_COMMUNITY): Payer: Medicare Other | Admitting: Anesthesiology

## 2017-03-28 DIAGNOSIS — J45909 Unspecified asthma, uncomplicated: Secondary | ICD-10-CM | POA: Insufficient documentation

## 2017-03-28 DIAGNOSIS — K861 Other chronic pancreatitis: Secondary | ICD-10-CM | POA: Diagnosis not present

## 2017-03-28 DIAGNOSIS — K8020 Calculus of gallbladder without cholecystitis without obstruction: Secondary | ICD-10-CM | POA: Diagnosis not present

## 2017-03-28 DIAGNOSIS — K8061 Calculus of gallbladder and bile duct with cholecystitis, unspecified, with obstruction: Secondary | ICD-10-CM | POA: Insufficient documentation

## 2017-03-28 DIAGNOSIS — K831 Obstruction of bile duct: Secondary | ICD-10-CM | POA: Insufficient documentation

## 2017-03-28 DIAGNOSIS — K862 Cyst of pancreas: Secondary | ICD-10-CM | POA: Insufficient documentation

## 2017-03-28 DIAGNOSIS — K8050 Calculus of bile duct without cholangitis or cholecystitis without obstruction: Secondary | ICD-10-CM

## 2017-03-28 DIAGNOSIS — R748 Abnormal levels of other serum enzymes: Secondary | ICD-10-CM | POA: Diagnosis not present

## 2017-03-28 DIAGNOSIS — K838 Other specified diseases of biliary tract: Secondary | ICD-10-CM | POA: Insufficient documentation

## 2017-03-28 DIAGNOSIS — K8689 Other specified diseases of pancreas: Secondary | ICD-10-CM | POA: Diagnosis not present

## 2017-03-28 DIAGNOSIS — Z9689 Presence of other specified functional implants: Secondary | ICD-10-CM | POA: Diagnosis not present

## 2017-03-28 DIAGNOSIS — K3189 Other diseases of stomach and duodenum: Secondary | ICD-10-CM | POA: Insufficient documentation

## 2017-03-28 DIAGNOSIS — R932 Abnormal findings on diagnostic imaging of liver and biliary tract: Secondary | ICD-10-CM | POA: Diagnosis not present

## 2017-03-28 HISTORY — PX: FINE NEEDLE ASPIRATION: SHX5430

## 2017-03-28 HISTORY — PX: ERCP: SHX5425

## 2017-03-28 HISTORY — PX: EUS: SHX5427

## 2017-03-28 SURGERY — ESOPHAGEAL ENDOSCOPIC ULTRASOUND (EUS) RADIAL
Anesthesia: General

## 2017-03-28 SURGERY — ERCP, WITH INTERVENTION IF INDICATED
Anesthesia: General

## 2017-03-28 MED ORDER — LACTATED RINGERS IV SOLN
INTRAVENOUS | Status: DC
  Administered 2017-03-28: 1000 mL via INTRAVENOUS

## 2017-03-28 MED ORDER — CIPROFLOXACIN IN D5W 400 MG/200ML IV SOLN
INTRAVENOUS | Status: DC | PRN
  Administered 2017-03-28: 400 mg via INTRAVENOUS

## 2017-03-28 MED ORDER — SODIUM CHLORIDE 0.9 % IV SOLN: INTRAVENOUS | Status: DC

## 2017-03-28 MED ORDER — LIDOCAINE 2% (20 MG/ML) 5 ML SYRINGE
INTRAMUSCULAR | Status: AC
  Filled 2017-03-28: qty 10

## 2017-03-28 MED ORDER — SUCCINYLCHOLINE CHLORIDE 200 MG/10ML IV SOSY
PREFILLED_SYRINGE | INTRAVENOUS | Status: AC
  Filled 2017-03-28: qty 10

## 2017-03-28 MED ORDER — ONDANSETRON HCL 4 MG/2ML IJ SOLN
INTRAMUSCULAR | Status: DC | PRN
  Administered 2017-03-28: 4 mg via INTRAVENOUS

## 2017-03-28 MED ORDER — FENTANYL CITRATE (PF) 100 MCG/2ML IJ SOLN
INTRAMUSCULAR | Status: AC
  Filled 2017-03-28: qty 2

## 2017-03-28 MED ORDER — PROPOFOL 10 MG/ML IV BOLUS
INTRAVENOUS | Status: DC | PRN
  Administered 2017-03-28: 150 mg via INTRAVENOUS
  Administered 2017-03-28: 50 mg via INTRAVENOUS

## 2017-03-28 MED ORDER — PHENYLEPHRINE HCL 10 MG/ML IJ SOLN
INTRAVENOUS | Status: DC | PRN
  Administered 2017-03-28: 25 ug/min via INTRAVENOUS

## 2017-03-28 MED ORDER — PHENYLEPHRINE HCL 10 MG/ML IJ SOLN
INTRAMUSCULAR | Status: AC
  Filled 2017-03-28: qty 1

## 2017-03-28 MED ORDER — PROPOFOL 10 MG/ML IV BOLUS
INTRAVENOUS | Status: AC
  Filled 2017-03-28: qty 20

## 2017-03-28 MED ORDER — FENTANYL CITRATE (PF) 100 MCG/2ML IJ SOLN
INTRAMUSCULAR | Status: DC | PRN
  Administered 2017-03-28: 50 ug via INTRAVENOUS

## 2017-03-28 MED ORDER — INDOMETHACIN 50 MG RE SUPP
RECTAL | Status: DC | PRN
  Administered 2017-03-28: 100 mg via RECTAL

## 2017-03-28 MED ORDER — MIDAZOLAM HCL 2 MG/2ML IJ SOLN
INTRAMUSCULAR | Status: DC | PRN
  Administered 2017-03-28: 0.5 mg via INTRAVENOUS

## 2017-03-28 MED ORDER — LACTATED RINGERS IV SOLN
INTRAVENOUS | Status: DC | PRN
  Administered 2017-03-28 (×2): via INTRAVENOUS

## 2017-03-28 MED ORDER — ROCURONIUM BROMIDE 50 MG/5ML IV SOSY
PREFILLED_SYRINGE | INTRAVENOUS | Status: AC
  Filled 2017-03-28: qty 5

## 2017-03-28 MED ORDER — CIPROFLOXACIN IN D5W 400 MG/200ML IV SOLN
INTRAVENOUS | Status: AC
  Filled 2017-03-28: qty 200

## 2017-03-28 MED ORDER — SODIUM CHLORIDE 0.9 % IV SOLN
INTRAVENOUS | Status: DC | PRN
  Administered 2017-03-28: 30 mL

## 2017-03-28 MED ORDER — SUCCINYLCHOLINE CHLORIDE 200 MG/10ML IV SOSY
PREFILLED_SYRINGE | INTRAVENOUS | Status: DC | PRN
  Administered 2017-03-28: 100 mg via INTRAVENOUS

## 2017-03-28 MED ORDER — LIDOCAINE 2% (20 MG/ML) 5 ML SYRINGE
INTRAMUSCULAR | Status: DC | PRN
  Administered 2017-03-28: 40 mg via INTRAVENOUS

## 2017-03-28 MED ORDER — ROCURONIUM BROMIDE 10 MG/ML (PF) SYRINGE
PREFILLED_SYRINGE | INTRAVENOUS | Status: DC | PRN
  Administered 2017-03-28 (×2): 10 mg via INTRAVENOUS

## 2017-03-28 MED ORDER — GLUCAGON HCL RDNA (DIAGNOSTIC) 1 MG IJ SOLR
INTRAMUSCULAR | Status: AC
  Filled 2017-03-28: qty 1

## 2017-03-28 MED ORDER — MIDAZOLAM HCL 2 MG/2ML IJ SOLN
INTRAMUSCULAR | Status: AC
  Filled 2017-03-28: qty 2

## 2017-03-28 MED ORDER — SUGAMMADEX SODIUM 200 MG/2ML IV SOLN
INTRAVENOUS | Status: DC | PRN
  Administered 2017-03-28: 175 mg via INTRAVENOUS

## 2017-03-28 MED ORDER — INDOMETHACIN 50 MG RE SUPP
RECTAL | Status: AC
  Filled 2017-03-28: qty 2

## 2017-03-28 MED ORDER — ONDANSETRON HCL 4 MG/2ML IJ SOLN
INTRAMUSCULAR | Status: AC
  Filled 2017-03-28: qty 2

## 2017-03-28 NOTE — Anesthesia Preprocedure Evaluation (Addendum)
Anesthesia Evaluation  Patient identified by MRN, date of birth, ID band Patient awake    Reviewed: Allergy & Precautions, NPO status , Patient's Chart, lab work & pertinent test results  Airway Mallampati: II  TM Distance: >3 FB Neck ROM: Full    Dental no notable dental hx. (+) Dental Advisory Given, Chipped   Pulmonary asthma ,    Pulmonary exam normal breath sounds clear to auscultation       Cardiovascular Normal cardiovascular exam Rhythm:Regular Rate:Normal     Neuro/Psych negative neurological ROS  negative psych ROS   GI/Hepatic negative GI ROS, Neg liver ROS,   Endo/Other  negative endocrine ROS  Renal/GU negative Renal ROS     Musculoskeletal negative musculoskeletal ROS (+)   Abdominal   Peds  Hematology negative hematology ROS (+)   Anesthesia Other Findings   Reproductive/Obstetrics negative OB ROS                            Anesthesia Physical Anesthesia Plan  ASA: II  Anesthesia Plan: General   Post-op Pain Management:    Induction: Intravenous  Airway Management Planned: Oral ETT and Video Laryngoscope Planned  Additional Equipment:   Intra-op Plan:   Post-operative Plan: Extubation in OR  Informed Consent: I have reviewed the patients History and Physical, chart, labs and discussed the procedure including the risks, benefits and alternatives for the proposed anesthesia with the patient or authorized representative who has indicated his/her understanding and acceptance.   Dental advisory given  Plan Discussed with: CRNA  Anesthesia Plan Comments:        Anesthesia Quick Evaluation

## 2017-03-28 NOTE — Anesthesia Procedure Notes (Signed)
Procedure Name: Intubation Date/Time: 03/28/2017 8:00 AM Performed by: Cynda Familia Pre-anesthesia Checklist: Patient identified, Emergency Drugs available, Suction available and Patient being monitored Patient Re-evaluated:Patient Re-evaluated prior to inductionOxygen Delivery Method: Circle System Utilized Preoxygenation: Pre-oxygenation with 100% oxygen Intubation Type: IV induction Ventilation: Mask ventilation without difficulty Laryngoscope Size: Miller and 2 Tube type: Oral Tube size: 7.0 mm Number of attempts: 1 Airway Equipment and Method: Stylet Placement Confirmation: ETT inserted through vocal cords under direct vision,  positive ETCO2 and breath sounds checked- equal and bilateral Secured at: 22 cm Tube secured with: Tape Dental Injury: Teeth and Oropharynx as per pre-operative assessment  Comments: Smooth IV induction [place]--- intubation AM CRNA atraumatic--- teeth ---front with chips prior to laryngoscopy--- bilat BS

## 2017-03-28 NOTE — Anesthesia Postprocedure Evaluation (Signed)
Anesthesia Post Note  Patient: Jonathan Dawson  Procedure(s) Performed: Procedure(s) (LRB): ESOPHAGEAL ENDOSCOPIC ULTRASOUND (EUS) RADIAL (N/A) FINE NEEDLE ASPIRATION (FNA) RADIAL (N/A) ENDOSCOPIC RETROGRADE CHOLANGIOPANCREATOGRAPHY (ERCP) (N/A)     Patient location during evaluation: PACU Anesthesia Type: General Level of consciousness: sedated and patient cooperative Pain management: pain level controlled Vital Signs Assessment: post-procedure vital signs reviewed and stable Respiratory status: spontaneous breathing Cardiovascular status: stable Anesthetic complications: no    Last Vitals:  Vitals:   03/28/17 1040 03/28/17 1050  BP: (!) 154/83 (!) 164/93  Pulse: 63 (!) 59  Resp: 18 17  Temp:      Last Pain:  Vitals:   03/28/17 1010  TempSrc: Oral                 [name]

## 2017-03-28 NOTE — H&P (Signed)
Patient interval history reviewed.  Patient examined again.  There has been no change from documented H/P (scanned into chart from our office) except as documented below.  Assessment:  1.  Abdominal pain, post-prandial. 2.  Elevated LFTs. 3.  Dilated bile duct. 4.  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  Plan:  1.  Endoscopic ultrasound with possible fine needle aspiration. 2.  Risks (bleeding, infection, bowel perforation that could require surgery, sedation-related changes in cardiopulmonary systems), benefits (identification and possible treatment of source of symptoms, exclusion of certain causes of symptoms), and alternatives (watchful waiting, radiographic imaging studies, empiric medical treatment) of upper endoscopy with ultrasound and possible fine needle aspiration (EUS +/- FNA) were explained to patient/family in detail and patient wishes to proceed. 3.  Endoscopic retrograde cholangiopancreatography with possible bile duct stent placement. 4.  Risks (up to and including bleeding, infection, perforation, pancreatitis that can be complicated by infected necrosis and death), benefits (removal of stones, alleviating blockage, decreasing risk of cholangitis or choledocholithiasis-related pancreatitis), and alternatives (watchful waiting, percutaneous transhepatic cholangiography) of ERCP were explained to patient/family in detail and patient elects to proceed.

## 2017-03-28 NOTE — Op Note (Signed)
[facility] Patient Name: Jonathan Dawson Procedure Date: 03/28/2017 MRN: [id number] Attending MD: Arta Silence , MD Date of Birth: 07/01/1941 CSN: [id number] Age: 76 Admit Type: Outpatient Procedure:                Upper EUS Indications:              Common bile duct dilation (acquired) seen on MRCP,                            Pancreatic cyst on MRCP, Dilated pancreatic duct on                            MRCP, Obstruction of bile duct on MRCP, Elevated                            liver enzymes Providers:                Arta Silence, MD, Teena Irani, MD, Cleda Daub,                            RN, Suhan Paci Dalton, Technician Referring MD:              Medicines:                General Anesthesia Complications:            No immediate complications. Estimated Blood Loss:     Estimated blood loss was minimal. Procedure:                Pre-Anesthesia Assessment:                           - Prior to the procedure, a History and Physical                            was performed, and patient medications and                            allergies were reviewed. The patient's tolerance of                            previous anesthesia was also reviewed. The risks                            and benefits of the procedure and the sedation                            options and risks were discussed with the patient.                            All questions were answered, and informed consent                            was obtained. Prior Anticoagulants: The patient has  taken no previous anticoagulant or antiplatelet                            agents. ASA Grade Assessment: II - A patient with                            mild systemic disease. After reviewing the risks                            and benefits, the patient was deemed in                            satisfactory condition to undergo the procedure.                           After obtaining  informed consent, the endoscope was                            passed under direct vision. Throughout the                            procedure, the patient's blood pressure, pulse, and                            oxygen saturations were monitored continuously. The                            [id number] ([id number]) scope was introduced through                            the mouth, and advanced to the second part of                            duodenum. The upper EUS was accomplished without                            difficulty. The patient tolerated the procedure                            well. Scope In: Scope Out: Findings:      Endosonographic Finding :      There was no sign of significant endosonographic abnormality in the       gastroesophageal junction.      Endosonographic images of the stomach were unremarkable.      Diffuse wall thickening was visualized endosonographically in the apex       of the duodenal bulb. The thickness of the abnormal layers.      There was no sign of significant endosonographic abnormality in the left       lobe of the liver.      Many stones were visualized endosonographically in the gallbladder.      There was dilation in the common bile duct which measured up to 12 mm.       There was mild thickening of the mid-to-distal CBD. There was irregular  tapering of the distal intrapancreatic portion of the CBD. There was       hyperechoic but shadowing 52mm defect in the mid-CBD consistent with       stone.      No lymphadenopathy seen.      Endosonographic imaging of the pancreas showed sonographic changes       indicative of moderate-severe chronic pancreatitis in the entire       pancreas. The parenchyma had hyperechoic strands, hyperechoic foci and       lobularity. The pancreatic duct had visible side-branches and a       tortuous/ectatic appearance. The pancreatic duct measured up to 5 mm in       diameter. The entire pancreas looked abnormal. I  could not discern a       mass, but a mass could easily be missed in the midst of such significant       and diffuse pancreatic irregularities.      Anechoic lesions suggestive of a few cysts were identified in the       pancreatic head and in the uncinate process of the pancreas. The head of       pancreas had collection of microcystic spaces, and one larger cyst.       Unclear whether the largest cyst communicates with the pancreatic duct.       The largest lesion measured 15 mm by 10 mm in maximal cross-sectional       diameter. Impression:               - There was no sign of significant pathology in the                            gastroesophageal junction.                           - Endosonographic images of the stomach were                            unremarkable.                           - Wall thickening was seen in the apex of the                            duodenal bulb.                           - There was no evidence of significant pathology in                            the left lobe of the liver.                           - Many stones were visualized endosonographically                            in the gallbladder.                           - Shadowing small hyperechoic defect in mid CBD  consistent with stone.                           - There was dilation in the common bile duct which                            measured up to 12 mm. Irregularity and wall                            thickening of the intrapancreatic portion of the                            distal CBD.                           - Endosonographic imaging of the pancreas showed                            sonographic changes of moderate-severe chronic                            pancreatitis. No obvious mass seen in pancreas, but                            in midst of such significant and diffuse chronic                            pancreatitis, a cholangiocarcinoma or small                             pancreatic adenocarcinoma could easily have been                            missed. No discrete mass was seen on MRI/MRCP                            either.                           - A few cystic lesions were seen in the pancreatic                            head and in the uncinate process of the pancreas.                           - Overall impressions are of diffuse pancreatic                            cystic irregularities which could be IPMN (main and                            side-branch) though head of pancreas looked almost  like serous cystadenoma. The irregular distal CBD                            with wall thickening, and 15 lb weight loss, is                            worrisome for malignancy, but the entire pancreas                            looked abnormal as well and a discrete mass could                            not be identified either in CBD or pancreas, thus                            no biopsies were obtained. There is a likely distal                            CBD stone as well, thus it's possible he had some                            passed CBD stones and has a stricture from that. Moderate Sedation:      N/A- Per Anesthesia Care Recommendation:           - Perform an ERCP today. Procedure Code(s):        --- Professional ---                           [phone], Esophagogastroduodenoscopy, flexible,                            transoral; with endoscopic ultrasound examination,                            including the esophagus, stomach, and either the                            duodenum or a surgically altered stomach where the                            jejunum is examined distal to the anastomosis Diagnosis Code(s):        --- Professional ---                           K31.89, Other diseases of stomach and duodenum                           K80.20, Calculus of gallbladder without                             cholecystitis without obstruction                           K83.8, Other specified diseases of biliary  tract                           K86.1, Other chronic pancreatitis                           K86.2, Cyst of pancreas                           K86.89, Other specified diseases of pancreas                           K83.1, Obstruction of bile duct                           R74.8, Abnormal levels of other serum enzymes CPT copyright 2016 [employer]. All rights reserved. The codes documented in this report are preliminary and upon coder review may  be revised to meet current compliance requirements. Arta Silence, MD 03/28/2017 8:46:49 AM This report has been signed electronically. Missy Sabins, MD Number of Addenda: 0

## 2017-03-28 NOTE — Op Note (Signed)
[facility] Patient Name: Jonathan Dawson Procedure Date: 03/28/2017 MRN: [id number] Attending MD: Missy Sabins , MD Date of Birth: [birth year] CSN: [id number] Age: 76 Admit Type: Outpatient Procedure:                ERCP Indications:              Abnormal MRCP, Elevated liver enzymes Providers:                Elyse Jarvis. Amedeo Plenty, MD, Cleda Daub, RN, William Dalton, Technician Referring MD:              Medicines:                General Anesthesia Complications:            No immediate complications. Estimated Blood Loss:     Estimated blood loss: none. Procedure:                Pre-Anesthesia Assessment:                           - Prior to the procedure, a History and Physical                            was performed, and patient medications and                            allergies were reviewed. The patient's tolerance of                            previous anesthesia was also reviewed. The risks                            and benefits of the procedure and the sedation                            options and risks were discussed with the patient.                            All questions were answered, and informed consent                            was obtained. Prior Anticoagulants: The patient has                            taken no previous anticoagulant or antiplatelet                            agents. ASA Grade Assessment: II - A patient with                            mild systemic disease. After reviewing the risks  and benefits, the patient was deemed in                            satisfactory condition to undergo the procedure.                           After obtaining informed consent, the scope was                            passed under direct vision. Throughout the                            procedure, the patient's blood pressure, pulse, and                            oxygen saturations were monitored  continuously. The                            was introduced through the mouth, and used to                            inject contrast into and used to inject contrast                            into the bile duct and ventral pancreatic duct. The                            ERCP was technically difficult and complex due to                            difficulty passing guidewires through biliary                            ductal stenosis. The patient tolerated the                            procedure well. Findings:      The major papilla was normal. The minor papilla was not found. The       ventral pancreatic duct was deeply cannulated with the traction       (standard) sphincterotome. Contrast was injected. I personally       interpreted the pancreatic duct images. Ductal flow of contrast was       adequate. Image quality was adequate. Contrast extended to the       pancreatic duct. The ventral pancreatic duct in the head of the pancreas       was dilated markedly. [Wire type] [Result] [Ducts]. The bile duct was       deeply cannulated. Contrast was injected. Opacification of the entire       biliary tree was successful. The maximum diameter of the ducts was 12       mm. The lower third of the main bile duct contained a single localized       stenosis 3 mm in length. Cells for cytology were obtained by brushing. A       3 mm  biliary sphincterotomy was made with a traction (standard)       sphincterotome using blended current. There was no post-sphincterotomy       bleeding. One 10 Fr by 5 cm plastic stent with two internal flaps was       placed 4 cm into the common bile duct. Bile flowed through the stent.       The stent was in good position. Impression:               - The major papilla appeared normal.                           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 The                            stricture was indeterminate.                           - Marked dilatation of the  ventral pancreatic duct                            in the head of the pancreas was found.                           - A biliary sphincterotomy was performed.                           - One plastic stent was placed into the common bile                            duct. Moderate Sedation:      no moderate sedation Recommendation:           - Await cytology results.                           - Check liver enzymes (AST, ALT, alkaline                            phosphatase, bilirubin) in 2 days.                           - Return to my office in 1 week. Procedure Code(s):        --- Professional ---                           [phone], Endoscopic retrograde                            cholangiopancreatography (ERCP); with placement of                            endoscopic stent into biliary or pancreatic duct,                            including pre- and post-dilation and guide wire  passage, when performed, including sphincterotomy,                            when performed, each stent                           [phone], Endoscopic catheterization of the pancreatic                            ductal system, radiological supervision and                            interpretation Diagnosis Code(s):        --- Professional ---                           K83.1, Obstruction of bile duct                           K86.89, Other specified diseases of pancreas                           R74.8, Abnormal levels of other serum enzymes                           R93.2, Abnormal findings on diagnostic imaging of                            liver and biliary tract CPT copyright 2016 [employer]. All rights reserved. The codes documented in this report are preliminary and upon coder review may  be revised to meet current compliance requirements. Missy Sabins, MD 03/28/2017 9:47:25 AM This report has been signed electronically. Number of Addenda: 0

## 2017-03-28 NOTE — Transfer of Care (Signed)
Immediate Anesthesia Transfer of Care Note  Patient: Jonathan Dawson  Procedure(s) Performed: Procedure(s): ESOPHAGEAL ENDOSCOPIC ULTRASOUND (EUS) RADIAL (N/A) FINE NEEDLE ASPIRATION (FNA) RADIAL (N/A) ENDOSCOPIC RETROGRADE CHOLANGIOPANCREATOGRAPHY (ERCP) (N/A)  Patient Location: PACU and Endoscopy Unit  Anesthesia Type:General  Level of Consciousness: awake, sedated and responds to stimulation  Airway & Oxygen Therapy: Patient Spontanous Breathing and Patient connected to face mask oxygen  Post-op Assessment: Report given to RN and Post -op Vital signs reviewed and stable  Post vital signs: Reviewed and stable  Last Vitals:  Vitals:   03/28/17 0707 03/28/17 0952  BP: (!) 150/92 (!) (P) 147/76  Pulse: 68   Resp: (!) 24 (P) 19  Temp: 36.7 C     Last Pain:  Vitals:   03/28/17 0707  TempSrc: Oral         Complications: No apparent anesthesia complications

## 2017-03-28 NOTE — Discharge Instructions (Signed)
Endoscopic Retrograde Cholangiopancreatogram, Care After °This sheet gives you information about how to care for yourself after your procedure. Your health care provider may also give you more specific instructions. If you have problems or questions, contact your health care provider. °What can I expect after the procedure? °After the procedure, it is common to have: °· Soreness in your throat. °· Nausea. °· Bloating. °· Dizziness. °· Tiredness (fatigue). ° °Follow these instructions at home: °· Take over-the-counter and prescription medicines only as told by your health care provider. °· Do not drive for 24 hours if you were given a medicine to help you relax (sedative) during your procedure. Have someone stay with you for 24 hours after the procedure. °· Return to your normal activities as told by your health care provider. Ask your health care provider what activities are safe for you. °· Return to eating what you normally do as soon as you feel well enough or as told by your health care provider. °· Keep all follow-up visits as told by your health care provider. This is important. °Contact a health care provider if: °· You have pain in your abdomen that does not get better with medicine. °· You develop signs of infection, such as: °? Chills. °? Feeling unwell. °Get help right away if: °· You have difficulty swallowing. °· You have worsening pain in your throat, chest, or abdomen. °· You vomit bright red blood or a substance that looks like coffee grounds. °· You have bloody or very black stools. °· You have a fever. °· You have a sudden increase in swelling (bloating) in your abdomen. °Summary °· After the procedure, it is common to feel tired and to have some discomfort in your throat. °· Contact your health care provider if you have signs of infection--such as chills or feeling unwell--or if you have pain that does not improve with medicine. °· Get help right away if you have trouble swallowing, worsening  pain, bloody or black vomit, bloody or black stools, a fever, or increased swelling in your abdomen. °· Keep all follow-up visits as told by your health care provider. This is important. °This information is not intended to replace advice given to you by your health care provider. Make sure you discuss any questions you have with your health care provider. °Document Released: 08/01/2013 Document Revised: 08/30/2016 Document Reviewed: 08/30/2016 °Elsevier Interactive Patient Education © 2017 [employer] ° °

## 2017-03-29 DIAGNOSIS — K862 Cyst of pancreas: Secondary | ICD-10-CM | POA: Diagnosis not present

## 2017-03-29 DIAGNOSIS — R740 Nonspecific elevation of levels of transaminase and lactic acid dehydrogenase [LDH]: Secondary | ICD-10-CM | POA: Diagnosis not present

## 2017-03-29 DIAGNOSIS — K831 Obstruction of bile duct: Secondary | ICD-10-CM | POA: Diagnosis not present

## 2017-03-29 DIAGNOSIS — K8020 Calculus of gallbladder without cholecystitis without obstruction: Secondary | ICD-10-CM | POA: Diagnosis not present

## 2017-03-30 ENCOUNTER — Encounter (HOSPITAL_COMMUNITY): Payer: Self-pay | Admitting: Gastroenterology

## 2017-03-31 DIAGNOSIS — R740 Nonspecific elevation of levels of transaminase and lactic acid dehydrogenase [LDH]: Secondary | ICD-10-CM | POA: Diagnosis not present

## 2017-04-05 DIAGNOSIS — K831 Obstruction of bile duct: Secondary | ICD-10-CM | POA: Diagnosis not present

## 2017-04-05 DIAGNOSIS — K8020 Calculus of gallbladder without cholecystitis without obstruction: Secondary | ICD-10-CM | POA: Diagnosis not present

## 2017-04-05 DIAGNOSIS — K862 Cyst of pancreas: Secondary | ICD-10-CM | POA: Diagnosis not present

## 2017-04-05 DIAGNOSIS — R740 Nonspecific elevation of levels of transaminase and lactic acid dehydrogenase [LDH]: Secondary | ICD-10-CM | POA: Diagnosis not present

## 2017-04-10 ENCOUNTER — Encounter (HOSPITAL_COMMUNITY): Payer: Self-pay | Admitting: Nurse Practitioner

## 2017-04-10 ENCOUNTER — Emergency Department (HOSPITAL_COMMUNITY)
Admission: EM | Admit: 2017-04-10 | Discharge: 2017-04-10 | Disposition: A | Discharge status: Home / Self Care | Payer: Medicare Other | Attending: Emergency Medicine | Admitting: Emergency Medicine

## 2017-04-10 DIAGNOSIS — R109 Unspecified abdominal pain: Secondary | ICD-10-CM | POA: Insufficient documentation

## 2017-04-10 DIAGNOSIS — R110 Nausea: Secondary | ICD-10-CM | POA: Diagnosis not present

## 2017-04-10 DIAGNOSIS — Z5321 Procedure and treatment not carried out due to patient leaving prior to being seen by health care provider: Secondary | ICD-10-CM | POA: Insufficient documentation

## 2017-04-10 LAB — CBC
HEMATOCRIT: 40.3 % (ref 39.0–52.0)
Hemoglobin: 14.2 g/dL (ref 13.0–17.0)
MCH: 32.3 pg (ref 26.0–34.0)
MCHC: 35.2 g/dL (ref 30.0–36.0)
MCV: 91.8 fL (ref 78.0–100.0)
Platelets: 285 10*3/uL (ref 150–400)
RBC: 4.39 MIL/uL (ref 4.22–5.81)
RDW: 13.6 % (ref 11.5–15.5)
WBC: 12.7 10*3/uL — AB (ref 4.0–10.5)

## 2017-04-10 LAB — BASIC METABOLIC PANEL
ANION GAP: 8 (ref 5–15)
BUN: 14 mg/dL (ref 6–20)
CALCIUM: 9.1 mg/dL (ref 8.9–10.3)
CO2: 28 mmol/L (ref 22–32)
Chloride: 101 mmol/L (ref 101–111)
Creatinine, Ser: 0.88 mg/dL (ref 0.61–1.24)
Glucose, Bld: 139 mg/dL — ABNORMAL HIGH (ref 65–99)
POTASSIUM: 4.5 mmol/L (ref 3.5–5.1)
Sodium: 137 mmol/L (ref 135–145)

## 2017-04-10 NOTE — ED Triage Notes (Signed)
Patient presents with Right Flank pain that he states started about 4pm yesterday. States he had an endoscopy 2 weeks ago with a stent placement in bile duct. Denies any fever or body aches. Some nausea throughout the day on yesterday. States the pain began to worsen tonight causing him to come in. Denies any rash, injury, heavy lifting, or pulling, constipation. Or diarrhea.

## 2017-04-10 NOTE — ED Notes (Signed)
Per registration patient's wife informed them that she was leaving due to chaos in lobby. Advised to wait but by the time writing nurse could go to the lobby patient and wife had gone.

## 2017-04-11 DIAGNOSIS — K831 Obstruction of bile duct: Secondary | ICD-10-CM | POA: Diagnosis not present

## 2017-04-29 DIAGNOSIS — K831 Obstruction of bile duct: Secondary | ICD-10-CM | POA: Diagnosis not present

## 2017-05-10 DIAGNOSIS — K831 Obstruction of bile duct: Secondary | ICD-10-CM | POA: Diagnosis not present

## 2017-05-10 DIAGNOSIS — R978 Other abnormal tumor markers: Secondary | ICD-10-CM | POA: Diagnosis not present

## 2017-05-10 DIAGNOSIS — Z9049 Acquired absence of other specified parts of digestive tract: Secondary | ICD-10-CM | POA: Diagnosis not present

## 2017-05-31 DIAGNOSIS — Z01818 Encounter for other preprocedural examination: Secondary | ICD-10-CM | POA: Diagnosis not present

## 2017-05-31 DIAGNOSIS — K831 Obstruction of bile duct: Secondary | ICD-10-CM | POA: Diagnosis not present

## 2017-05-31 DIAGNOSIS — K869 Disease of pancreas, unspecified: Secondary | ICD-10-CM | POA: Diagnosis not present

## 2017-05-31 DIAGNOSIS — K8689 Other specified diseases of pancreas: Secondary | ICD-10-CM | POA: Diagnosis not present

## 2017-06-02 DIAGNOSIS — R590 Localized enlarged lymph nodes: Secondary | ICD-10-CM | POA: Diagnosis not present

## 2017-06-02 DIAGNOSIS — K8689 Other specified diseases of pancreas: Secondary | ICD-10-CM | POA: Diagnosis not present

## 2017-06-02 DIAGNOSIS — D378 Neoplasm of uncertain behavior of other specified digestive organs: Secondary | ICD-10-CM | POA: Diagnosis not present

## 2017-06-02 DIAGNOSIS — K862 Cyst of pancreas: Secondary | ICD-10-CM | POA: Diagnosis not present

## 2017-06-02 DIAGNOSIS — K838 Other specified diseases of biliary tract: Secondary | ICD-10-CM | POA: Diagnosis not present

## 2017-06-14 DIAGNOSIS — R978 Other abnormal tumor markers: Secondary | ICD-10-CM | POA: Diagnosis not present

## 2017-06-14 DIAGNOSIS — K831 Obstruction of bile duct: Secondary | ICD-10-CM | POA: Diagnosis not present

## 2017-06-24 DIAGNOSIS — R945 Abnormal results of liver function studies: Secondary | ICD-10-CM | POA: Diagnosis present

## 2017-06-24 DIAGNOSIS — Z9689 Presence of other specified functional implants: Secondary | ICD-10-CM | POA: Diagnosis not present

## 2017-06-24 DIAGNOSIS — R17 Unspecified jaundice: Secondary | ICD-10-CM | POA: Diagnosis not present

## 2017-06-24 DIAGNOSIS — R740 Nonspecific elevation of levels of transaminase and lactic acid dehydrogenase [LDH]: Secondary | ICD-10-CM | POA: Diagnosis not present

## 2017-06-24 DIAGNOSIS — Z85828 Personal history of other malignant neoplasm of skin: Secondary | ICD-10-CM | POA: Diagnosis not present

## 2017-06-24 DIAGNOSIS — E871 Hypo-osmolality and hyponatremia: Secondary | ICD-10-CM | POA: Diagnosis not present

## 2017-06-24 DIAGNOSIS — T85898A Other specified complication of other internal prosthetic devices, implants and grafts, initial encounter: Secondary | ICD-10-CM | POA: Diagnosis not present

## 2017-06-24 DIAGNOSIS — D3501 Benign neoplasm of right adrenal gland: Secondary | ICD-10-CM | POA: Diagnosis not present

## 2017-06-24 DIAGNOSIS — K8689 Other specified diseases of pancreas: Secondary | ICD-10-CM | POA: Diagnosis not present

## 2017-06-24 DIAGNOSIS — R7881 Bacteremia: Secondary | ICD-10-CM | POA: Diagnosis not present

## 2017-06-24 DIAGNOSIS — I10 Essential (primary) hypertension: Secondary | ICD-10-CM | POA: Diagnosis not present

## 2017-06-24 DIAGNOSIS — R1310 Dysphagia, unspecified: Secondary | ICD-10-CM | POA: Diagnosis present

## 2017-06-24 DIAGNOSIS — R112 Nausea with vomiting, unspecified: Secondary | ICD-10-CM | POA: Diagnosis not present

## 2017-06-24 DIAGNOSIS — K838 Other specified diseases of biliary tract: Secondary | ICD-10-CM | POA: Diagnosis not present

## 2017-06-24 DIAGNOSIS — K710 Toxic liver disease with cholestasis: Secondary | ICD-10-CM | POA: Diagnosis not present

## 2017-06-24 DIAGNOSIS — R791 Abnormal coagulation profile: Secondary | ICD-10-CM | POA: Diagnosis not present

## 2017-06-24 DIAGNOSIS — K315 Obstruction of duodenum: Secondary | ICD-10-CM | POA: Diagnosis not present

## 2017-06-24 DIAGNOSIS — N289 Disorder of kidney and ureter, unspecified: Secondary | ICD-10-CM | POA: Diagnosis not present

## 2017-06-24 DIAGNOSIS — K869 Disease of pancreas, unspecified: Secondary | ICD-10-CM | POA: Diagnosis not present

## 2017-06-24 DIAGNOSIS — Z85820 Personal history of malignant melanoma of skin: Secondary | ICD-10-CM | POA: Diagnosis not present

## 2017-06-24 DIAGNOSIS — K219 Gastro-esophageal reflux disease without esophagitis: Secondary | ICD-10-CM | POA: Diagnosis not present

## 2017-06-24 DIAGNOSIS — K5900 Constipation, unspecified: Secondary | ICD-10-CM | POA: Diagnosis not present

## 2017-06-24 DIAGNOSIS — K449 Diaphragmatic hernia without obstruction or gangrene: Secondary | ICD-10-CM | POA: Diagnosis present

## 2017-06-24 DIAGNOSIS — B961 Klebsiella pneumoniae [K. pneumoniae] as the cause of diseases classified elsewhere: Secondary | ICD-10-CM | POA: Diagnosis not present

## 2017-06-24 DIAGNOSIS — R609 Edema, unspecified: Secondary | ICD-10-CM | POA: Diagnosis not present

## 2017-06-24 DIAGNOSIS — C250 Malignant neoplasm of head of pancreas: Secondary | ICD-10-CM | POA: Diagnosis not present

## 2017-06-24 DIAGNOSIS — K830 Cholangitis: Secondary | ICD-10-CM | POA: Diagnosis not present

## 2017-06-24 DIAGNOSIS — K2970 Gastritis, unspecified, without bleeding: Secondary | ICD-10-CM | POA: Diagnosis not present

## 2017-06-24 DIAGNOSIS — R110 Nausea: Secondary | ICD-10-CM | POA: Diagnosis not present

## 2017-06-24 DIAGNOSIS — R933 Abnormal findings on diagnostic imaging of other parts of digestive tract: Secondary | ICD-10-CM | POA: Diagnosis not present

## 2017-06-24 DIAGNOSIS — K831 Obstruction of bile duct: Secondary | ICD-10-CM | POA: Diagnosis not present

## 2017-06-24 DIAGNOSIS — B9689 Other specified bacterial agents as the cause of diseases classified elsewhere: Secondary | ICD-10-CM | POA: Diagnosis not present

## 2017-06-24 DIAGNOSIS — D72829 Elevated white blood cell count, unspecified: Secondary | ICD-10-CM | POA: Diagnosis not present

## 2017-06-24 DIAGNOSIS — D582 Other hemoglobinopathies: Secondary | ICD-10-CM | POA: Diagnosis not present

## 2017-07-01 DIAGNOSIS — C44729 Squamous cell carcinoma of skin of left lower limb, including hip: Secondary | ICD-10-CM | POA: Diagnosis not present

## 2017-07-11 DIAGNOSIS — C250 Malignant neoplasm of head of pancreas: Secondary | ICD-10-CM | POA: Diagnosis not present

## 2017-07-11 DIAGNOSIS — K869 Disease of pancreas, unspecified: Secondary | ICD-10-CM | POA: Diagnosis not present

## 2017-07-11 DIAGNOSIS — R7989 Other specified abnormal findings of blood chemistry: Secondary | ICD-10-CM | POA: Diagnosis not present

## 2017-07-18 DIAGNOSIS — C4492 Squamous cell carcinoma of skin, unspecified: Secondary | ICD-10-CM | POA: Diagnosis not present

## 2017-07-18 DIAGNOSIS — Z85820 Personal history of malignant melanoma of skin: Secondary | ICD-10-CM | POA: Diagnosis not present

## 2017-07-18 DIAGNOSIS — C259 Malignant neoplasm of pancreas, unspecified: Secondary | ICD-10-CM | POA: Diagnosis not present

## 2017-07-18 DIAGNOSIS — Z85828 Personal history of other malignant neoplasm of skin: Secondary | ICD-10-CM | POA: Diagnosis not present

## 2017-07-18 DIAGNOSIS — Z86008 Personal history of in-situ neoplasm of other site: Secondary | ICD-10-CM | POA: Diagnosis not present

## 2017-07-18 DIAGNOSIS — C44629 Squamous cell carcinoma of skin of left upper limb, including shoulder: Secondary | ICD-10-CM | POA: Diagnosis not present

## 2017-07-25 DIAGNOSIS — K769 Liver disease, unspecified: Secondary | ICD-10-CM | POA: Diagnosis not present

## 2017-07-25 DIAGNOSIS — R110 Nausea: Secondary | ICD-10-CM | POA: Diagnosis not present

## 2017-07-25 DIAGNOSIS — C250 Malignant neoplasm of head of pancreas: Secondary | ICD-10-CM | POA: Diagnosis not present

## 2017-07-25 DIAGNOSIS — R1013 Epigastric pain: Secondary | ICD-10-CM | POA: Diagnosis not present

## 2017-08-02 DIAGNOSIS — C250 Malignant neoplasm of head of pancreas: Secondary | ICD-10-CM | POA: Diagnosis not present

## 2017-08-02 DIAGNOSIS — Z79899 Other long term (current) drug therapy: Secondary | ICD-10-CM | POA: Diagnosis not present

## 2017-08-02 DIAGNOSIS — Z5181 Encounter for therapeutic drug level monitoring: Secondary | ICD-10-CM | POA: Diagnosis not present

## 2017-08-04 DIAGNOSIS — C250 Malignant neoplasm of head of pancreas: Secondary | ICD-10-CM | POA: Diagnosis not present

## 2017-08-04 DIAGNOSIS — C259 Malignant neoplasm of pancreas, unspecified: Secondary | ICD-10-CM | POA: Diagnosis not present

## 2017-08-11 DIAGNOSIS — R229 Localized swelling, mass and lump, unspecified: Secondary | ICD-10-CM | POA: Diagnosis not present

## 2017-08-11 DIAGNOSIS — N400 Enlarged prostate without lower urinary tract symptoms: Secondary | ICD-10-CM | POA: Diagnosis not present

## 2017-08-11 DIAGNOSIS — K219 Gastro-esophageal reflux disease without esophagitis: Secondary | ICD-10-CM | POA: Diagnosis not present

## 2017-08-11 DIAGNOSIS — C250 Malignant neoplasm of head of pancreas: Secondary | ICD-10-CM | POA: Diagnosis not present

## 2017-08-11 DIAGNOSIS — Z79899 Other long term (current) drug therapy: Secondary | ICD-10-CM | POA: Diagnosis not present

## 2017-08-11 DIAGNOSIS — Z85828 Personal history of other malignant neoplasm of skin: Secondary | ICD-10-CM | POA: Diagnosis not present

## 2017-08-11 DIAGNOSIS — Z7982 Long term (current) use of aspirin: Secondary | ICD-10-CM | POA: Diagnosis not present

## 2017-08-11 DIAGNOSIS — I4510 Unspecified right bundle-branch block: Secondary | ICD-10-CM | POA: Diagnosis not present

## 2017-08-11 DIAGNOSIS — F1011 Alcohol abuse, in remission: Secondary | ICD-10-CM | POA: Diagnosis not present

## 2017-08-15 DIAGNOSIS — Z6823 Body mass index (BMI) 23.0-23.9, adult: Secondary | ICD-10-CM | POA: Diagnosis not present

## 2017-08-15 DIAGNOSIS — K219 Gastro-esophageal reflux disease without esophagitis: Secondary | ICD-10-CM | POA: Diagnosis not present

## 2017-08-15 DIAGNOSIS — K30 Functional dyspepsia: Secondary | ICD-10-CM | POA: Diagnosis not present

## 2017-08-15 DIAGNOSIS — R110 Nausea: Secondary | ICD-10-CM | POA: Diagnosis not present

## 2017-08-15 DIAGNOSIS — C250 Malignant neoplasm of head of pancreas: Secondary | ICD-10-CM | POA: Diagnosis not present

## 2017-08-15 DIAGNOSIS — Z5111 Encounter for antineoplastic chemotherapy: Secondary | ICD-10-CM | POA: Diagnosis not present

## 2017-08-15 DIAGNOSIS — E46 Unspecified protein-calorie malnutrition: Secondary | ICD-10-CM | POA: Diagnosis not present

## 2017-08-15 DIAGNOSIS — Z713 Dietary counseling and surveillance: Secondary | ICD-10-CM | POA: Diagnosis not present

## 2017-08-23 DIAGNOSIS — R110 Nausea: Secondary | ICD-10-CM | POA: Diagnosis not present

## 2017-08-23 DIAGNOSIS — K219 Gastro-esophageal reflux disease without esophagitis: Secondary | ICD-10-CM | POA: Diagnosis not present

## 2017-08-23 DIAGNOSIS — Z5111 Encounter for antineoplastic chemotherapy: Secondary | ICD-10-CM | POA: Diagnosis not present

## 2017-08-23 DIAGNOSIS — Z515 Encounter for palliative care: Secondary | ICD-10-CM | POA: Diagnosis not present

## 2017-08-23 DIAGNOSIS — C250 Malignant neoplasm of head of pancreas: Secondary | ICD-10-CM | POA: Diagnosis not present

## 2017-08-23 DIAGNOSIS — T451X5A Adverse effect of antineoplastic and immunosuppressive drugs, initial encounter: Secondary | ICD-10-CM | POA: Diagnosis not present

## 2017-08-30 DIAGNOSIS — Z515 Encounter for palliative care: Secondary | ICD-10-CM | POA: Diagnosis not present

## 2017-08-30 DIAGNOSIS — C250 Malignant neoplasm of head of pancreas: Secondary | ICD-10-CM | POA: Diagnosis not present

## 2017-08-30 DIAGNOSIS — Z5111 Encounter for antineoplastic chemotherapy: Secondary | ICD-10-CM | POA: Diagnosis not present

## 2017-08-30 DIAGNOSIS — R630 Anorexia: Secondary | ICD-10-CM | POA: Diagnosis not present

## 2017-08-30 DIAGNOSIS — Z6822 Body mass index (BMI) 22.0-22.9, adult: Secondary | ICD-10-CM | POA: Diagnosis not present

## 2017-08-30 DIAGNOSIS — K5900 Constipation, unspecified: Secondary | ICD-10-CM | POA: Diagnosis not present

## 2017-08-30 DIAGNOSIS — K219 Gastro-esophageal reflux disease without esophagitis: Secondary | ICD-10-CM | POA: Diagnosis not present

## 2017-08-30 DIAGNOSIS — T451X5A Adverse effect of antineoplastic and immunosuppressive drugs, initial encounter: Secondary | ICD-10-CM | POA: Diagnosis not present

## 2017-08-30 DIAGNOSIS — R5383 Other fatigue: Secondary | ICD-10-CM | POA: Diagnosis not present

## 2017-08-30 DIAGNOSIS — R110 Nausea: Secondary | ICD-10-CM | POA: Diagnosis not present

## 2017-08-30 DIAGNOSIS — R140 Abdominal distension (gaseous): Secondary | ICD-10-CM | POA: Diagnosis not present

## 2017-09-10 DIAGNOSIS — Z515 Encounter for palliative care: Secondary | ICD-10-CM | POA: Diagnosis not present

## 2017-09-10 DIAGNOSIS — J690 Pneumonitis due to inhalation of food and vomit: Secondary | ICD-10-CM | POA: Diagnosis not present

## 2017-09-10 DIAGNOSIS — R933 Abnormal findings on diagnostic imaging of other parts of digestive tract: Secondary | ICD-10-CM | POA: Diagnosis not present

## 2017-09-10 DIAGNOSIS — I4891 Unspecified atrial fibrillation: Secondary | ICD-10-CM | POA: Diagnosis not present

## 2017-09-10 DIAGNOSIS — D72829 Elevated white blood cell count, unspecified: Secondary | ICD-10-CM | POA: Diagnosis not present

## 2017-09-10 DIAGNOSIS — E861 Hypovolemia: Secondary | ICD-10-CM | POA: Diagnosis not present

## 2017-09-10 DIAGNOSIS — R935 Abnormal findings on diagnostic imaging of other abdominal regions, including retroperitoneum: Secondary | ICD-10-CM | POA: Diagnosis not present

## 2017-09-10 DIAGNOSIS — K315 Obstruction of duodenum: Secondary | ICD-10-CM | POA: Diagnosis not present

## 2017-09-10 DIAGNOSIS — K56609 Unspecified intestinal obstruction, unspecified as to partial versus complete obstruction: Secondary | ICD-10-CM | POA: Diagnosis not present

## 2017-09-10 DIAGNOSIS — Z4659 Encounter for fitting and adjustment of other gastrointestinal appliance and device: Secondary | ICD-10-CM | POA: Diagnosis not present

## 2017-09-10 DIAGNOSIS — J9811 Atelectasis: Secondary | ICD-10-CM | POA: Diagnosis not present

## 2017-09-10 DIAGNOSIS — J479 Bronchiectasis, uncomplicated: Secondary | ICD-10-CM | POA: Diagnosis not present

## 2017-09-10 DIAGNOSIS — T182XXA Foreign body in stomach, initial encounter: Secondary | ICD-10-CM | POA: Diagnosis not present

## 2017-09-10 DIAGNOSIS — E871 Hypo-osmolality and hyponatremia: Secondary | ICD-10-CM | POA: Diagnosis not present

## 2017-09-10 DIAGNOSIS — Z452 Encounter for adjustment and management of vascular access device: Secondary | ICD-10-CM | POA: Diagnosis not present

## 2017-09-10 DIAGNOSIS — Z4682 Encounter for fitting and adjustment of non-vascular catheter: Secondary | ICD-10-CM | POA: Diagnosis not present

## 2017-09-10 DIAGNOSIS — R918 Other nonspecific abnormal finding of lung field: Secondary | ICD-10-CM | POA: Diagnosis not present

## 2017-09-10 DIAGNOSIS — C250 Malignant neoplasm of head of pancreas: Secondary | ICD-10-CM | POA: Diagnosis not present

## 2017-09-10 DIAGNOSIS — D490 Neoplasm of unspecified behavior of digestive system: Secondary | ICD-10-CM | POA: Diagnosis not present

## 2017-09-20 DIAGNOSIS — C250 Malignant neoplasm of head of pancreas: Secondary | ICD-10-CM | POA: Diagnosis not present

## 2017-09-20 DIAGNOSIS — Z9689 Presence of other specified functional implants: Secondary | ICD-10-CM | POA: Diagnosis not present

## 2017-09-20 DIAGNOSIS — R32 Unspecified urinary incontinence: Secondary | ICD-10-CM | POA: Diagnosis not present

## 2017-09-20 DIAGNOSIS — E46 Unspecified protein-calorie malnutrition: Secondary | ICD-10-CM | POA: Diagnosis not present

## 2017-09-20 DIAGNOSIS — Z713 Dietary counseling and surveillance: Secondary | ICD-10-CM | POA: Diagnosis not present

## 2017-09-20 DIAGNOSIS — Z6822 Body mass index (BMI) 22.0-22.9, adult: Secondary | ICD-10-CM | POA: Diagnosis not present

## 2017-09-20 DIAGNOSIS — D72829 Elevated white blood cell count, unspecified: Secondary | ICD-10-CM | POA: Diagnosis not present

## 2017-09-20 DIAGNOSIS — R634 Abnormal weight loss: Secondary | ICD-10-CM | POA: Diagnosis not present

## 2017-09-20 DIAGNOSIS — R531 Weakness: Secondary | ICD-10-CM | POA: Diagnosis not present

## 2017-09-27 DIAGNOSIS — D72829 Elevated white blood cell count, unspecified: Secondary | ICD-10-CM | POA: Diagnosis not present

## 2017-09-27 DIAGNOSIS — Z515 Encounter for palliative care: Secondary | ICD-10-CM | POA: Diagnosis not present

## 2017-09-27 DIAGNOSIS — C250 Malignant neoplasm of head of pancreas: Secondary | ICD-10-CM | POA: Diagnosis not present

## 2017-09-27 DIAGNOSIS — Z5111 Encounter for antineoplastic chemotherapy: Secondary | ICD-10-CM | POA: Diagnosis not present

## 2017-09-27 DIAGNOSIS — Z9689 Presence of other specified functional implants: Secondary | ICD-10-CM | POA: Diagnosis not present

## 2017-09-27 DIAGNOSIS — R634 Abnormal weight loss: Secondary | ICD-10-CM | POA: Diagnosis not present

## 2017-09-27 DIAGNOSIS — R531 Weakness: Secondary | ICD-10-CM | POA: Diagnosis not present

## 2017-09-27 DIAGNOSIS — R32 Unspecified urinary incontinence: Secondary | ICD-10-CM | POA: Diagnosis not present

## 2017-09-28 ENCOUNTER — Emergency Department (HOSPITAL_BASED_OUTPATIENT_CLINIC_OR_DEPARTMENT_OTHER)
Admission: EM | Admit: 2017-09-28 | Discharge: 2017-09-28 | Disposition: A | Discharge status: Home / Self Care | Payer: Medicare Other | Attending: Emergency Medicine | Admitting: Emergency Medicine

## 2017-09-28 ENCOUNTER — Encounter (HOSPITAL_BASED_OUTPATIENT_CLINIC_OR_DEPARTMENT_OTHER): Payer: Self-pay

## 2017-09-28 ENCOUNTER — Other Ambulatory Visit: Payer: Self-pay

## 2017-09-28 DIAGNOSIS — S60416A Abrasion of right little finger, initial encounter: Secondary | ICD-10-CM | POA: Diagnosis not present

## 2017-09-28 DIAGNOSIS — W01198A Fall on same level from slipping, tripping and stumbling with subsequent striking against other object, initial encounter: Secondary | ICD-10-CM | POA: Insufficient documentation

## 2017-09-28 DIAGNOSIS — Y999 Unspecified external cause status: Secondary | ICD-10-CM | POA: Diagnosis not present

## 2017-09-28 DIAGNOSIS — Z23 Encounter for immunization: Secondary | ICD-10-CM | POA: Diagnosis not present

## 2017-09-28 DIAGNOSIS — S098XXA Other specified injuries of head, initial encounter: Secondary | ICD-10-CM | POA: Diagnosis not present

## 2017-09-28 DIAGNOSIS — S0181XA Laceration without foreign body of other part of head, initial encounter: Secondary | ICD-10-CM

## 2017-09-28 DIAGNOSIS — S0081XA Abrasion of other part of head, initial encounter: Secondary | ICD-10-CM | POA: Diagnosis not present

## 2017-09-28 DIAGNOSIS — Z79899 Other long term (current) drug therapy: Secondary | ICD-10-CM | POA: Insufficient documentation

## 2017-09-28 DIAGNOSIS — J45909 Unspecified asthma, uncomplicated: Secondary | ICD-10-CM | POA: Insufficient documentation

## 2017-09-28 DIAGNOSIS — S0990XA Unspecified injury of head, initial encounter: Secondary | ICD-10-CM

## 2017-09-28 DIAGNOSIS — Y929 Unspecified place or not applicable: Secondary | ICD-10-CM | POA: Insufficient documentation

## 2017-09-28 DIAGNOSIS — S0993XA Unspecified injury of face, initial encounter: Secondary | ICD-10-CM | POA: Diagnosis present

## 2017-09-28 DIAGNOSIS — Y9301 Activity, walking, marching and hiking: Secondary | ICD-10-CM | POA: Insufficient documentation

## 2017-09-28 DIAGNOSIS — T07XXXA Unspecified multiple injuries, initial encounter: Secondary | ICD-10-CM

## 2017-09-28 DIAGNOSIS — Z7982 Long term (current) use of aspirin: Secondary | ICD-10-CM | POA: Diagnosis not present

## 2017-09-28 HISTORY — DX: Malignant neoplasm of pancreas, unspecified: C25.9

## 2017-09-28 MED ORDER — TETANUS-DIPHTH-ACELL PERTUSSIS 5-2.5-18.5 LF-MCG/0.5 IM SUSP
INTRAMUSCULAR | Status: AC
  Filled 2017-09-28: qty 0.5

## 2017-09-28 MED ORDER — TETANUS-DIPHTH-ACELL PERTUSSIS 5-2.5-18.5 LF-MCG/0.5 IM SUSP
0.5000 mL | Freq: Once | INTRAMUSCULAR | Status: AC
  Administered 2017-09-28: 0.5 mL via INTRAMUSCULAR

## 2017-09-28 MED ORDER — LIDOCAINE HCL 1 % IJ SOLN
INTRAMUSCULAR | Status: AC
  Filled 2017-09-28: qty 20

## 2017-09-28 NOTE — ED Provider Notes (Signed)
[facility] EMERGENCY DEPARTMENT Provider Note   CSN: [id number] Arrival date & time: 09/28/17  1210     History   Chief Complaint Chief Complaint  Patient presents with  . Fall    HPI Jonathan Dawson is a 76 y.o. male.  HPI Patient normally walks with a cane.  States he was walking without the cane today and lost his balance and fell from standing.  Struck the right side of his face, right shoulder, right hand and right knee on the concrete.  Sustained minor abrasions.  Patient denies any loss of consciousness.  Denies headache or neck pain.  No focal weakness or numbness.  This happened roughly 3 hours ago.  Unknown last tetanus. Past Medical History:  Diagnosis Date  . Allergy   . Asthma   . Cancer ([place])    skin cancer- leg  . Pancreatic cancer [facility])     Patient Active Problem List   Diagnosis Date Noted  . Lumbar back pain 04/12/2016  . Melanoma of skin ([place]) 12/30/2015  . Eustachian tube dysfunction 11/01/2014  . Acute bronchitis 08/30/2014  . Patellofemoral arthralgia of right knee 05/14/2014  . Visual disturbance 04/29/2014  . Personal history of colonic polyps 04/04/2014  . Right wrist pain 10/30/2013  . Enlarged prostate 06/05/2013  . Left wrist sprain 05/07/2013  . Cervical radiculitis 05/07/2013  . Eczema 12/20/2012  . Erectile dysfunction 12/20/2012  . Right bundle branch block 12/20/2012  . Seasonal allergies 12/20/2012  . History of elevated PSA 12/20/2012  . LACERATION, HAND, LEFT 06/13/2011    Past Surgical History:  Procedure Laterality Date  . APPENDECTOMY  1981  . ERCP N/A 03/28/2017   Procedure: ENDOSCOPIC RETROGRADE CHOLANGIOPANCREATOGRAPHY (ERCP);  Surgeon: Teena Irani, MD;  Location: [facility];  Service: Endoscopy;  Laterality: N/A;  . EUS N/A 03/28/2017   Procedure: ESOPHAGEAL ENDOSCOPIC ULTRASOUND (EUS) RADIAL;  Surgeon: Arta Silence, MD;  Location: [facility] ENDOSCOPY;  Service: Endoscopy;  Laterality: N/A;  . FINE NEEDLE  ASPIRATION N/A 03/28/2017   Procedure: FINE NEEDLE ASPIRATION (FNA) RADIAL;  Surgeon: Arta Silence, MD;  Location: [facility] ENDOSCOPY;  Service: Endoscopy;  Laterality: N/A;  . left hand surgery  2011       Home Medications    Prior to Admission medications   Medication Sig Start Date End Date Taking? Authorizing Provider  acetaminophen (TYLENOL) 325 MG tablet Take 325-650 mg by mouth 2 (two) times daily as needed for mild pain.    [provider]  aspirin 81 MG tablet Take 81 mg by mouth See admin instructions. Takes a couple times a month    [provider]  cetirizine (ZYRTEC) 10 MG tablet Take 10 mg by mouth daily as needed for allergies.    [provider]  clobetasol cream (TEMOVATE) 3.50 % Apply 1 application topically 4 (four) times daily as needed. Spots on arms and legs 06/08/16   [provider]  fluticasone (FLONASE) 50 MCG/ACT nasal spray Place 2 sprays into both nostrils daily. Patient taking differently: Place 2 sprays into both nostrils daily as needed for allergies.  07/18/14   Marcial Pacas, DO  Investigational - Study Medication Take 500 mg by mouth 2 (two) times daily. INV-nicotinamide riboside (INVESTIGATIONAL STUDY) 250 mg capsule    [provider]  ipratropium (ATROVENT) 0.06 % nasal spray Place 1 spray into both nostrils 2 (two) times daily as needed for rhinitis.    [provider]  LEVITRA 20 MG tablet TAKE ONE-FOURTH TO ONE-HALF  TABLET BY MOUTH AS NEEDED Patient taking differently: TAKE ONE-FOURTH TO ONE-HALF TABLET BY MOUTH AS NEEDED FOR ERECTILE DYSFUNCTION 10/21/15   Marcial Pacas, DO  omeprazole (PRILOSEC) 40 MG capsule Take 40 mg by mouth 2 (two) times daily.    [provider]  ondansetron (ZOFRAN) 4 MG tablet Take 4 mg by mouth 2 (two) times daily as needed for nausea/vomiting. 03/08/17   [provider]  oxybutynin (DITROPAN) 5 MG tablet Take 5 mg by mouth daily. 01/10/17   [provider]  ranitidine (ZANTAC) 150 MG tablet Take 150 mg by mouth 2 (two) times daily.    [provider]  triamcinolone cream (KENALOG) 0.1 % Apply topically 2 (two) times daily. Patient taking differently: Apply 1 application topically 2 (two) times daily.  12/20/12   Marcial Pacas, DO    Family History Family History  Problem Relation Age of Onset  . Diabetes Mother   . Heart failure Father   . Stroke Father   . Alcoholism Father   . Colon cancer Neg Hx   . Esophageal cancer Neg Hx   . Rectal cancer Neg Hx   . Stomach cancer Neg Hx     Social History Social History   Tobacco Use  . Smoking status: Never Smoker  . Smokeless tobacco: Never Used  Substance Use Topics  . Alcohol use: No    Frequency: Never  . Drug use: No     Allergies   Patient has no known allergies.   Review of Systems Review of Systems  Constitutional: Negative for chills and fever.  HENT: Negative for facial swelling.   Eyes: Negative for visual disturbance.  Respiratory: Negative for shortness of breath.   Cardiovascular: Negative for chest pain, palpitations and leg swelling.  Gastrointestinal: Negative for abdominal pain, diarrhea, nausea and vomiting.  Musculoskeletal: Positive for arthralgias. Negative for back pain, myalgias and neck pain.  Skin: Positive for wound. Negative for rash.  Neurological: Negative for dizziness, syncope, weakness, light-headedness, numbness and headaches.  All other systems reviewed and are negative.    Physical Exam Updated Vital Signs BP 127/82 (BP Location: Left Arm)   Pulse 90   Temp 97.7 F (36.5 C) (Oral)   Resp 20   Ht 5\' 7"  (1.702 m)   Wt 63 kg (139 lb)   SpO2 99%   BMI 21.77 kg/m   Physical Exam  Constitutional: He is oriented to person, place, and time. He appears well-developed and well-nourished.  HENT:  Head: Normocephalic.  Mouth/Throat: Oropharynx is clear and moist.  Patient with abrasion to the lateral right eyebrow.  Just  lateral to the right eye patient has a 1 cm laceration.  There is no active bleeding.  Wound edges are closely aligned.  No obvious contamination.  Midface is stable.  No malocclusion.  Eyes: EOM are normal. Pupils are equal, round, and reactive to light.  Neck: Normal range of motion. Neck supple.  No posterior midline cervical tenderness to palpation.  Cardiovascular: Normal rate and regular rhythm.  Pulmonary/Chest: Effort normal and breath sounds normal.  Abdominal: Soft. Bowel sounds are normal. There is no tenderness. There is no rebound and no guarding.  Musculoskeletal: Normal range of motion. He exhibits no edema or tenderness.  Full range of motion of all extremities without pain, deformity or swelling.  Distal pulses are 2+.  Neurological: He is alert and oriented to person, place, and time.  Skin: Skin is warm and dry. Capillary refill takes less than  2 seconds. No rash noted. No erythema.  Patient has superficial abrasions to the digits of the right hand, the anterior right shoulder and the lateral right knee.  Psychiatric: He has a normal mood and affect. His behavior is normal.  Nursing note and vitals reviewed.    ED Treatments / Results  Labs (all labs ordered are listed, but only abnormal results are displayed) Labs Reviewed - No data to display  EKG  EKG Interpretation None       Radiology No results found.  Procedures .Marland KitchenLaceration Repair Date/Time: 09/28/2017 2:07 PM Performed by: Julianne Rice, MD Authorized by: Julianne Rice, MD   Consent:    Consent obtained:  Verbal Laceration details:    Location:  Face   Face location:  R eyebrow   Length (cm):  1 Repair type:    Repair type:  Simple Exploration:    Contaminated: no   Treatment:    Amount of cleaning:  Standard Skin repair:    Repair method:  Tissue adhesive Approximation:    Approximation:  Close   Vermilion border: well-aligned   Post-procedure details:    Patient tolerance of  procedure:  Tolerated well, no immediate complications   (including critical care time)  Medications Ordered in ED Medications  lidocaine (XYLOCAINE) 1 % (with pres) injection (not administered)  Tdap (BOOSTRIX) 5-2.5-18.5 LF-MCG/0.5 injection (not administered)  Tdap (BOOSTRIX) injection 0.5 mL (0.5 mLs Intramuscular Given 09/28/17 1311)     Initial Impression / Assessment and Plan / ED Course  I have reviewed the triage vital signs and the nursing notes.  Pertinent labs & imaging results that were available during my care of the patient were reviewed by me and considered in my medical decision making (see chart for details).     Patient is not on a blood thinner.  Discussed imaging studies with the patient.  Patient does not want any imaging at this time.  Understands the need to return immediately if he develops headache, visual changes, focal weakness, persistent vomiting or for any concerns.  Final Clinical Impressions(s) / ED Diagnoses   Final diagnoses:  Injury of head, initial encounter  Facial laceration, initial encounter  Multiple abrasions    ED Discharge Orders    None       Julianne Rice, MD 09/28/17 1408

## 2017-09-28 NOTE — ED Notes (Signed)
ED Provider at bedside. 
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
## 2017-10-11 DIAGNOSIS — Z90411 Acquired partial absence of pancreas: Secondary | ICD-10-CM | POA: Diagnosis not present

## 2017-10-11 DIAGNOSIS — E876 Hypokalemia: Secondary | ICD-10-CM | POA: Diagnosis not present

## 2017-10-11 DIAGNOSIS — Z79899 Other long term (current) drug therapy: Secondary | ICD-10-CM | POA: Diagnosis not present

## 2017-10-11 DIAGNOSIS — C250 Malignant neoplasm of head of pancreas: Secondary | ICD-10-CM | POA: Diagnosis not present

## 2017-10-11 DIAGNOSIS — R350 Frequency of micturition: Secondary | ICD-10-CM | POA: Diagnosis not present

## 2017-10-11 DIAGNOSIS — Z5111 Encounter for antineoplastic chemotherapy: Secondary | ICD-10-CM | POA: Diagnosis not present

## 2022-12-28 ENCOUNTER — Other Ambulatory Visit: Payer: Self-pay

## 2022-12-28 ENCOUNTER — Emergency Department (HOSPITAL_COMMUNITY): Payer: PPO

## 2022-12-28 ENCOUNTER — Encounter (HOSPITAL_COMMUNITY): Payer: Self-pay | Admitting: Emergency Medicine

## 2022-12-28 ENCOUNTER — Emergency Department (HOSPITAL_COMMUNITY)
Admission: EM | Admit: 2022-12-28 | Discharge: 2022-12-28 | Disposition: A | Discharge status: Home / Self Care | Payer: PPO | Attending: Emergency Medicine | Admitting: Emergency Medicine

## 2022-12-28 DIAGNOSIS — S0081XA Abrasion of other part of head, initial encounter: Secondary | ICD-10-CM | POA: Diagnosis not present

## 2022-12-28 DIAGNOSIS — S51011A Laceration without foreign body of right elbow, initial encounter: Secondary | ICD-10-CM | POA: Insufficient documentation

## 2022-12-28 DIAGNOSIS — S61412A Laceration without foreign body of left hand, initial encounter: Secondary | ICD-10-CM | POA: Diagnosis not present

## 2022-12-28 DIAGNOSIS — Y92481 Parking lot as the place of occurrence of the external cause: Secondary | ICD-10-CM | POA: Diagnosis not present

## 2022-12-28 DIAGNOSIS — Z7982 Long term (current) use of aspirin: Secondary | ICD-10-CM | POA: Insufficient documentation

## 2022-12-28 DIAGNOSIS — S81012A Laceration without foreign body, left knee, initial encounter: Secondary | ICD-10-CM | POA: Insufficient documentation

## 2022-12-28 DIAGNOSIS — S81011A Laceration without foreign body, right knee, initial encounter: Secondary | ICD-10-CM | POA: Diagnosis not present

## 2022-12-28 DIAGNOSIS — S8991XA Unspecified injury of right lower leg, initial encounter: Secondary | ICD-10-CM | POA: Diagnosis present

## 2022-12-28 DIAGNOSIS — R9082 White matter disease, unspecified: Secondary | ICD-10-CM | POA: Diagnosis not present

## 2022-12-28 DIAGNOSIS — Z7901 Long term (current) use of anticoagulants: Secondary | ICD-10-CM | POA: Insufficient documentation

## 2022-12-28 DIAGNOSIS — W19XXXA Unspecified fall, initial encounter: Secondary | ICD-10-CM

## 2022-12-28 MED ORDER — ACETAMINOPHEN 500 MG PO TABS
1000.0000 mg | ORAL_TABLET | Freq: Once | ORAL | Status: AC
  Administered 2022-12-28: 1000 mg via ORAL
  Filled 2022-12-28: qty 2

## 2022-12-28 NOTE — ED Notes (Signed)
Patient ambulated well around nursing station. MD made aware.
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
## 2022-12-28 NOTE — ED Notes (Signed)
Patient transported to X-ray 

## 2022-12-28 NOTE — Discharge Instructions (Signed)
   Return for any problem.  Keep abrasions and skin tears clean and dry.  Follow-up with your regular care provider within the next 1 to 2 days.
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
## 2024-01-06 ENCOUNTER — Emergency Department (HOSPITAL_COMMUNITY)

## 2024-01-06 ENCOUNTER — Encounter (HOSPITAL_COMMUNITY): Payer: Self-pay

## 2024-01-06 ENCOUNTER — Other Ambulatory Visit: Payer: Self-pay

## 2024-01-06 ENCOUNTER — Telehealth (HOSPITAL_COMMUNITY): Payer: Self-pay | Admitting: Pharmacy Technician

## 2024-01-06 ENCOUNTER — Other Ambulatory Visit (HOSPITAL_COMMUNITY): Payer: Self-pay

## 2024-01-06 ENCOUNTER — Inpatient Hospital Stay (HOSPITAL_COMMUNITY)
Admission: EM | Admit: 2024-01-06 | Discharge: 2024-01-09 | DRG: 641 | Disposition: A | Discharge status: Home Health | Attending: Family Medicine | Admitting: Family Medicine

## 2024-01-06 ENCOUNTER — Other Ambulatory Visit (HOSPITAL_COMMUNITY)

## 2024-01-06 DIAGNOSIS — E1122 Type 2 diabetes mellitus with diabetic chronic kidney disease: Secondary | ICD-10-CM | POA: Diagnosis present

## 2024-01-06 DIAGNOSIS — N400 Enlarged prostate without lower urinary tract symptoms: Secondary | ICD-10-CM | POA: Diagnosis present

## 2024-01-06 DIAGNOSIS — Z8507 Personal history of malignant neoplasm of pancreas: Secondary | ICD-10-CM | POA: Diagnosis not present

## 2024-01-06 DIAGNOSIS — I129 Hypertensive chronic kidney disease with stage 1 through stage 4 chronic kidney disease, or unspecified chronic kidney disease: Secondary | ICD-10-CM | POA: Diagnosis present

## 2024-01-06 DIAGNOSIS — E860 Dehydration: Secondary | ICD-10-CM | POA: Diagnosis present

## 2024-01-06 DIAGNOSIS — R112 Nausea with vomiting, unspecified: Secondary | ICD-10-CM | POA: Diagnosis not present

## 2024-01-06 DIAGNOSIS — E538 Deficiency of other specified B group vitamins: Secondary | ICD-10-CM | POA: Diagnosis present

## 2024-01-06 DIAGNOSIS — C772 Secondary and unspecified malignant neoplasm of intra-abdominal lymph nodes: Secondary | ICD-10-CM | POA: Diagnosis present

## 2024-01-06 DIAGNOSIS — Z8619 Personal history of other infectious and parasitic diseases: Secondary | ICD-10-CM | POA: Insufficient documentation

## 2024-01-06 DIAGNOSIS — N1831 Chronic kidney disease, stage 3a: Secondary | ICD-10-CM | POA: Diagnosis present

## 2024-01-06 DIAGNOSIS — Z79899 Other long term (current) drug therapy: Secondary | ICD-10-CM | POA: Diagnosis not present

## 2024-01-06 DIAGNOSIS — I7121 Aneurysm of the ascending aorta, without rupture: Secondary | ICD-10-CM | POA: Diagnosis present

## 2024-01-06 DIAGNOSIS — Z8546 Personal history of malignant neoplasm of prostate: Secondary | ICD-10-CM | POA: Diagnosis not present

## 2024-01-06 DIAGNOSIS — E11649 Type 2 diabetes mellitus with hypoglycemia without coma: Secondary | ICD-10-CM | POA: Diagnosis not present

## 2024-01-06 DIAGNOSIS — E1165 Type 2 diabetes mellitus with hyperglycemia: Secondary | ICD-10-CM | POA: Diagnosis present

## 2024-01-06 DIAGNOSIS — I351 Nonrheumatic aortic (valve) insufficiency: Secondary | ICD-10-CM | POA: Diagnosis present

## 2024-01-06 DIAGNOSIS — E1140 Type 2 diabetes mellitus with diabetic neuropathy, unspecified: Secondary | ICD-10-CM | POA: Diagnosis present

## 2024-01-06 DIAGNOSIS — R296 Repeated falls: Secondary | ICD-10-CM | POA: Diagnosis present

## 2024-01-06 DIAGNOSIS — R110 Nausea: Secondary | ICD-10-CM | POA: Diagnosis present

## 2024-01-06 DIAGNOSIS — Z86718 Personal history of other venous thrombosis and embolism: Secondary | ICD-10-CM

## 2024-01-06 DIAGNOSIS — Z85828 Personal history of other malignant neoplasm of skin: Secondary | ICD-10-CM

## 2024-01-06 DIAGNOSIS — E876 Hypokalemia: Secondary | ICD-10-CM

## 2024-01-06 DIAGNOSIS — Z789 Other specified health status: Secondary | ICD-10-CM

## 2024-01-06 DIAGNOSIS — N189 Chronic kidney disease, unspecified: Secondary | ICD-10-CM | POA: Insufficient documentation

## 2024-01-06 DIAGNOSIS — K219 Gastro-esophageal reflux disease without esophagitis: Secondary | ICD-10-CM | POA: Diagnosis present

## 2024-01-06 DIAGNOSIS — M545 Low back pain, unspecified: Secondary | ICD-10-CM | POA: Diagnosis present

## 2024-01-06 DIAGNOSIS — R1110 Vomiting, unspecified: Secondary | ICD-10-CM

## 2024-01-06 DIAGNOSIS — Z794 Long term (current) use of insulin: Secondary | ICD-10-CM

## 2024-01-06 DIAGNOSIS — R350 Frequency of micturition: Secondary | ICD-10-CM | POA: Diagnosis present

## 2024-01-06 DIAGNOSIS — Z1152 Encounter for screening for COVID-19: Secondary | ICD-10-CM | POA: Diagnosis not present

## 2024-01-06 DIAGNOSIS — Z7901 Long term (current) use of anticoagulants: Secondary | ICD-10-CM | POA: Diagnosis not present

## 2024-01-06 DIAGNOSIS — Z90411 Acquired partial absence of pancreas: Secondary | ICD-10-CM

## 2024-01-06 DIAGNOSIS — E559 Vitamin D deficiency, unspecified: Secondary | ICD-10-CM | POA: Diagnosis present

## 2024-01-06 DIAGNOSIS — I10 Essential (primary) hypertension: Secondary | ICD-10-CM | POA: Insufficient documentation

## 2024-01-06 DIAGNOSIS — R55 Syncope and collapse: Secondary | ICD-10-CM | POA: Diagnosis not present

## 2024-01-06 DIAGNOSIS — E1169 Type 2 diabetes mellitus with other specified complication: Secondary | ICD-10-CM | POA: Insufficient documentation

## 2024-01-06 LAB — RESP PANEL BY RT-PCR (RSV, FLU A&B, COVID)  RVPGX2
Influenza A by PCR: NEGATIVE
Influenza B by PCR: NEGATIVE
Resp Syncytial Virus by PCR: NEGATIVE
SARS Coronavirus 2 by RT PCR: NEGATIVE

## 2024-01-06 LAB — CBC WITH DIFFERENTIAL/PLATELET
Abs Immature Granulocytes: 0.07 10*3/uL (ref 0.00–0.07)
Basophils Absolute: 0.1 10*3/uL (ref 0.0–0.1)
Basophils Relative: 1 %
Eosinophils Absolute: 0 10*3/uL (ref 0.0–0.5)
Eosinophils Relative: 0 %
HCT: 41.5 % (ref 39.0–52.0)
Hemoglobin: 13.7 g/dL (ref 13.0–17.0)
Immature Granulocytes: 1 %
Lymphocytes Relative: 8 %
Lymphs Abs: 1.1 10*3/uL (ref 0.7–4.0)
MCH: 29.8 pg (ref 26.0–34.0)
MCHC: 33 g/dL (ref 30.0–36.0)
MCV: 90.2 fL (ref 80.0–100.0)
Monocytes Absolute: 1 10*3/uL (ref 0.1–1.0)
Monocytes Relative: 7 %
Neutro Abs: 11.9 10*3/uL — ABNORMAL HIGH (ref 1.7–7.7)
Neutrophils Relative %: 83 %
Platelets: 256 10*3/uL (ref 150–400)
RBC: 4.6 MIL/uL (ref 4.22–5.81)
RDW: 13.9 % (ref 11.5–15.5)
WBC: 14.1 10*3/uL — ABNORMAL HIGH (ref 4.0–10.5)
nRBC: 0 % (ref 0.0–0.2)

## 2024-01-06 LAB — I-STAT VENOUS BLOOD GAS, ED
Acid-base deficit: 1 mmol/L (ref 0.0–2.0)
Bicarbonate: 23.8 mmol/L (ref 20.0–28.0)
Calcium, Ion: 1.09 mmol/L — ABNORMAL LOW (ref 1.15–1.40)
HCT: 37 % — ABNORMAL LOW (ref 39.0–52.0)
Hemoglobin: 12.6 g/dL — ABNORMAL LOW (ref 13.0–17.0)
O2 Saturation: 62 %
Potassium: 3.2 mmol/L — ABNORMAL LOW (ref 3.5–5.1)
Sodium: 143 mmol/L (ref 135–145)
TCO2: 25 mmol/L (ref 22–32)
pCO2, Ven: 38.2 mmHg — ABNORMAL LOW (ref 44–60)
pH, Ven: 7.402 (ref 7.25–7.43)
pO2, Ven: 32 mmHg (ref 32–45)

## 2024-01-06 LAB — URINALYSIS, ROUTINE W REFLEX MICROSCOPIC
Bilirubin Urine: NEGATIVE
Glucose, UA: >=500 mg/dL — AB
Ketones, ur: NEGATIVE mg/dL
Nitrite: NEGATIVE
Protein, ur: NEGATIVE mg/dL
Specific Gravity, Urine: 1.006 (ref 1.005–1.030)
WBC, UA: >50 WBC/hpf (ref 0–5)
pH: 5 (ref 5.0–8.0)

## 2024-01-06 LAB — GLUCOSE, CAPILLARY
Glucose-Capillary: 216 mg/dL — ABNORMAL HIGH (ref 70–99)
Glucose-Capillary: 167 mg/dL — ABNORMAL HIGH (ref 70–99)

## 2024-01-06 LAB — I-STAT CG4 LACTIC ACID, ED: Lactic Acid, Venous: 1.9 mmol/L (ref 0.5–1.9)

## 2024-01-06 LAB — COMPREHENSIVE METABOLIC PANEL
ALT: 26 U/L (ref 0–44)
AST: 23 U/L (ref 15–41)
Albumin: 3.4 g/dL — ABNORMAL LOW (ref 3.5–5.0)
Alkaline Phosphatase: 132 U/L — ABNORMAL HIGH (ref 38–126)
Anion gap: 15 (ref 5–15)
BUN: 16 mg/dL (ref 8–23)
CO2: 20 mmol/L — ABNORMAL LOW (ref 22–32)
Calcium: 9.1 mg/dL (ref 8.9–10.3)
Chloride: 106 mmol/L (ref 98–111)
Creatinine, Ser: 1.33 mg/dL — ABNORMAL HIGH (ref 0.61–1.24)
GFR, Estimated: 53 mL/min — ABNORMAL LOW (ref >60)
Glucose, Bld: 321 mg/dL — ABNORMAL HIGH (ref 70–99)
Potassium: 3 mmol/L — ABNORMAL LOW (ref 3.5–5.1)
Sodium: 141 mmol/L (ref 135–145)
Total Bilirubin: 1 mg/dL (ref 0.0–1.2)
Total Protein: 6.6 g/dL (ref 6.5–8.1)

## 2024-01-06 LAB — TROPONIN I (HIGH SENSITIVITY)
Troponin I (High Sensitivity): 16 ng/L (ref <18)
Troponin I (High Sensitivity): 17 ng/L (ref <18)

## 2024-01-06 LAB — MAGNESIUM: Magnesium: 2.1 mg/dL (ref 1.7–2.4)

## 2024-01-06 LAB — CK: Total CK: 156 U/L (ref 49–397)

## 2024-01-06 LAB — CBG MONITORING, ED: Glucose-Capillary: 187 mg/dL — ABNORMAL HIGH (ref 70–99)

## 2024-01-06 MED ORDER — FLUCONAZOLE 50 MG PO TABS
50.0000 mg | ORAL_TABLET | Freq: Once | ORAL | Status: AC
  Administered 2024-01-06: 50 mg via ORAL
  Filled 2024-01-06: qty 1

## 2024-01-06 MED ORDER — SODIUM CHLORIDE 0.9 % IV BOLUS
500.0000 mL | Freq: Once | INTRAVENOUS | Status: AC
  Administered 2024-01-06: 500 mL via INTRAVENOUS

## 2024-01-06 MED ORDER — ACETAMINOPHEN 325 MG PO TABS: 650.0000 mg | ORAL_TABLET | Freq: Four times a day (QID) | ORAL | Status: DC | PRN

## 2024-01-06 MED ORDER — TAMSULOSIN HCL 0.4 MG PO CAPS
0.4000 mg | ORAL_CAPSULE | Freq: Every day | ORAL | Status: DC
  Administered 2024-01-07 – 2024-01-09 (×3): 0.4 mg via ORAL
  Filled 2024-01-06 (×3): qty 1

## 2024-01-06 MED ORDER — ONDANSETRON HCL 4 MG/2ML IJ SOLN
4.0000 mg | Freq: Once | INTRAMUSCULAR | Status: AC
  Administered 2024-01-06: 4 mg via INTRAVENOUS
  Filled 2024-01-06: qty 2

## 2024-01-06 MED ORDER — INSULIN ASPART 100 UNIT/ML IJ SOLN
0.0000 [IU] | Freq: Three times a day (TID) | INTRAMUSCULAR | Status: DC
  Administered 2024-01-06: 3 [IU] via SUBCUTANEOUS
  Administered 2024-01-07: 5 [IU] via SUBCUTANEOUS
  Administered 2024-01-07 (×2): 2 [IU] via SUBCUTANEOUS
  Administered 2024-01-08: 1 [IU] via SUBCUTANEOUS
  Administered 2024-01-08: 3 [IU] via SUBCUTANEOUS
  Administered 2024-01-09: 1 [IU] via SUBCUTANEOUS
  Administered 2024-01-09: 9 [IU] via SUBCUTANEOUS
  Administered 2024-01-09: 1 [IU] via SUBCUTANEOUS

## 2024-01-06 MED ORDER — LISINOPRIL 20 MG PO TABS: 40.0000 mg | ORAL_TABLET | Freq: Every day | ORAL | Status: DC

## 2024-01-06 MED ORDER — POTASSIUM CHLORIDE 20 MEQ PO PACK
60.0000 meq | PACK | Freq: Every day | ORAL | Status: DC
  Administered 2024-01-06 – 2024-01-09 (×4): 60 meq via ORAL
  Filled 2024-01-06 (×4): qty 3

## 2024-01-06 MED ORDER — INSULIN GLARGINE 100 UNIT/ML ~~LOC~~ SOLN
25.0000 [IU] | Freq: Every day | SUBCUTANEOUS | Status: DC
  Administered 2024-01-06: 25 [IU] via SUBCUTANEOUS
  Filled 2024-01-06 (×2): qty 0.25

## 2024-01-06 MED ORDER — ADULT MULTIVITAMIN W/MINERALS CH
1.0000 | ORAL_TABLET | Freq: Every day | ORAL | Status: DC
  Administered 2024-01-06 – 2024-01-09 (×4): 1 via ORAL
  Filled 2024-01-06 (×4): qty 1

## 2024-01-06 MED ORDER — PANCRELIPASE (LIP-PROT-AMYL) 12000-38000 UNITS PO CPEP
24000.0000 [IU] | ORAL_CAPSULE | Freq: Three times a day (TID) | ORAL | Status: DC
  Administered 2024-01-06 – 2024-01-09 (×10): 24000 [IU] via ORAL
  Filled 2024-01-06 (×11): qty 2

## 2024-01-06 MED ORDER — SODIUM CHLORIDE 0.9 % IV BOLUS
1000.0000 mL | Freq: Once | INTRAVENOUS | Status: AC
  Administered 2024-01-06: 1000 mL via INTRAVENOUS

## 2024-01-06 MED ORDER — ONDANSETRON HCL 4 MG/2ML IJ SOLN
4.0000 mg | Freq: Three times a day (TID) | INTRAMUSCULAR | Status: DC | PRN
  Administered 2024-01-06 – 2024-01-07 (×2): 4 mg via INTRAVENOUS
  Filled 2024-01-06 (×2): qty 2

## 2024-01-06 MED ORDER — FLUCONAZOLE 150 MG PO TABS
150.0000 mg | ORAL_TABLET | Freq: Once | ORAL | Status: AC
  Administered 2024-01-06: 150 mg via ORAL
  Filled 2024-01-06: qty 1

## 2024-01-06 MED ORDER — FLUCONAZOLE 200 MG PO TABS
200.0000 mg | ORAL_TABLET | Freq: Every day | ORAL | Status: DC
  Administered 2024-01-07 – 2024-01-09 (×3): 200 mg via ORAL
  Filled 2024-01-06 (×3): qty 1

## 2024-01-06 MED ORDER — OYSTER SHELL CALCIUM/D3 500-5 MG-MCG PO TABS
2.0000 | ORAL_TABLET | Freq: Every day | ORAL | Status: DC
  Administered 2024-01-07 – 2024-01-09 (×3): 2 via ORAL
  Filled 2024-01-06 (×3): qty 2

## 2024-01-06 MED ORDER — RIVAROXABAN 10 MG PO TABS
20.0000 mg | ORAL_TABLET | Freq: Every day | ORAL | Status: DC
  Administered 2024-01-07 – 2024-01-09 (×3): 20 mg via ORAL
  Filled 2024-01-06 (×3): qty 2

## 2024-01-06 MED ORDER — CARVEDILOL 12.5 MG PO TABS
12.5000 mg | ORAL_TABLET | Freq: Two times a day (BID) | ORAL | Status: DC
  Administered 2024-01-06 – 2024-01-09 (×7): 12.5 mg via ORAL
  Filled 2024-01-06 (×7): qty 1

## 2024-01-06 MED ORDER — VITAMIN B-12 1000 MCG PO TABS
1000.0000 ug | ORAL_TABLET | Freq: Every day | ORAL | Status: DC
  Administered 2024-01-06 – 2024-01-09 (×4): 1000 ug via ORAL
  Filled 2024-01-06 (×4): qty 1

## 2024-01-06 NOTE — Telephone Encounter (Signed)
 Patient Product/process development scientist completed.    The patient is insured through [employer]/ [employer]. Patient has Medicare and is not eligible for a copay card, but may be able to apply for patient assistance or Medicare RX Payment Plan (Patient Must reach out to their plan, if eligible for payment plan), if available.    Ran test claim for Novolog Pen and the current 30 day co-pay is $35.00.  Ran test claim for Humalog Pen and the current 30 day co-pay is $35.00.  Ran test claim for [employer] 3 Sensor and the current 30 day co-pay is $0.00.  This test claim was processed through [facility]- copay amounts may vary at other pharmacies due to pharmacy/plan contracts, or as the patient moves through the different stages of their insurance plan.     Jonathan Dawson, CPHT Pharmacy Technician III Certified Patient Advocate [facility],The Pharmacy Patient Advocate Team Direct Number: [phone]  Fax: [phone]

## 2024-01-06 NOTE — Assessment & Plan Note (Signed)
 Elevated blood sugar in the 300s with anion gap of 15.  Home medications of Tresiba 28 units daily. -Start Lantus 25 units daily -sSSI while vomiting -Carb modified diet -VBG -Consider insulin drip if blood sugars remain elevated

## 2024-01-06 NOTE — Assessment & Plan Note (Signed)
 No abdominal pain or signs of obstruction on CT scan.  Possibly related to untreated fungemia -UA with budding yeast.  Does have elevated blood sugar and anion gap, negative urine ketones-will obtain VBG. -Zofran as needed -Troponin -VBG

## 2024-01-06 NOTE — Assessment & Plan Note (Signed)
 Fall yesterday, unclear whether true syncopal event.  Did have lightheadedness when standing up and nausea. -Admit to FMTS, med telemetry, attending Dr. Manson Passey -Orthostatic vital signs -Cardiac monitoring -Fall precautions -PT/OT -S/p 1.5 L NS, reassess need for fluids -Echocardiogram -CBC, CMP, mag

## 2024-01-06 NOTE — Hospital Course (Addendum)
 Jonathan Dawson is a 83 y.o. 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  Falls Unclear cause, however suspect multifactorial etiology including hypoglycemic episodes, orthostasis, dehydration, and deconditioning.  Had some hypoglycemic readings on decreased insulin during hospitalization.  ACS workup was unremarkable and echo obtained showed EF of 70-75%, mild-moderate aortic regurgitation with calcification, and 4mm ascending aortic aneurysm.  Orthostatic vitals were positive and lisinopril was held.  Worked with PT/OT who recommended Home Health to improve strength and patient was discharged home in stable condition after shared-decision making conversation with patient and spouse, both in agreement with plan.  T2DM A1c 12.0 in February.  Initially presented with hyperglycemia, however had an episode of hypoglycemia after initiation of basal insulin home dose 28 units daily.  Patient not checking CBGs at home.  Suspect possible hypoglycemia related to falls.  CBGs were closely monitored, and patient was discharged home on the following regimen:  Tresiba 15 units and novolog 2 units daily with largest meals if CBG >150.  CGM sample was provided at discharge.  Nausea  Hypokalemia Dehydration 2/2 emesis believed to be contributing to patient's falls and hypokalemia.  Nausea resolved with Zofran as needed and hypokalemia resolved after repletion and resolution of emesis.  Hx of Fungal Infection Urine culture positive for yeast.  Spoke with infectious disease, who felt patient did not successfully complete his Diflucan course prior hospitalization and recommended resuming Diflucan through 3/27.  Other chronic conditions were  medically managed with home medications and formulary alternatives as necessary (pancreatic insufficiency s/p Whipple, BPH, Port-A-Cath/SVC occlusion, vitamin deficiencies).  Follow-up recommendations: Please review insulin regimen, titrate or taper as necessary.  Decreased patient to 15 units long acting daily given hypoglycemia, added 2 units novolog with largest meal of day for CBG >150. Patient also provided with CGM at discharge, but unable to set up the app due to not having password to his phone.  Please assist him with setting it up if needed, but also diabetes coordinator information for assistance provided in discharge paperwork. Holding lisinopril given orthostatics at discharge. Consider holding tamsulosin if persistently experiencing symptomatic orthostasis. Recommend repeat Echocardiogram in 1 year for 44 mm ascending aorta aneurysm. Consider bisphosphonate for vertebral fracture. Consider outpatient evaluation for function and imaging follow-up of L adrenal adenoma.

## 2024-01-06 NOTE — Inpatient Diabetes Management (Signed)
 Inpatient Diabetes Program Recommendations  AACE/ADA: New Consensus Statement on Inpatient Glycemic Control (2015)  Target Ranges:  Prepandial:   less than 140 mg/dL      Peak postprandial:   less than 180 mg/dL (1-2 hours)      Critically ill patients:  140 - 180 mg/dL   Lab Results  Component Value Date   GLUCAP 187 (H) 01/06/2024      Discharge Recommendations: Other recommendations: Freestyle Libre 3 sensor order # [id number] Short acting recommendations:  Meal + Correction coverage Insulin aspart (NOVOLOG) FlexPen  Sensitive Scale.  TBD Supply/Referral recommendations: Pen needles - standard   Use Adult Diabetes Insulin Treatment Post Discharge order set.  Thanks,  Christena Deem RN, MSN, BC-ADM Inpatient Diabetes Coordinator Team Pager [phone] (8a-5p)

## 2024-01-06 NOTE — Assessment & Plan Note (Signed)
 3.0, repleted with 60 meq in ED. - Add on Mag - AM CMP

## 2024-01-06 NOTE — Discharge Instructions (Addendum)
 Dear Jonathan Dawson,   Thank you so much for allowing [place] to be part of your care!  You were admitted to [facility] for frequent falls, which we suspect are due to a combination of factors including low blood sugars, low blood pressure due to medicines, dehydration, and muscle weakness/deconditioning.  DISCHARGE TREATMENT PLANS A1c 12% at last PCP visit, based on whipple procedure and glucose trends you may need adjustment of your insulin up or down. We will start continuous glucose monitoring with the Freestyle Libre 3 sensor, download the app on the app store, also feel free for family members to download the Librelink up app to be able to share glucose trends with family members. Call if needed for assistance in downloading and applying freestyle Libre 3 sensor continuous glucose monitor: Christena Deem [phone] Diabetes Nurse  POST-HOSPITAL & CARE INSTRUCTIONS Please let PCP/Specialists know of any changes that were made.  Please see medications section of this packet for any medication changes. Home Health PT and OT will come out to your home for physical therapy. Please use caution getting up and walking around to avoid further falls.  DOCTOR'S APPOINTMENT & FOLLOW UP CARE INSTRUCTIONS  Please call your PCP as soon as possible and schedule a follow up appointment within 1 week at the latest. Please bring your CGM monitor provided to you at discharge to that appointment.  RETURN PRECAUTIONS: Please call EMS or return to the ED if Jonathan Dawson falls again or is getting progressively weaker and unable to care for himself  Take care and be well!  Family Medicine Teaching Service Inpatient Team [facility]  [address] [place], [place] [address] [phone]

## 2024-01-06 NOTE — Assessment & Plan Note (Signed)
 [facility] hospitalization for fungemia complicated fungal urinary infection with acute obstruction ureteral stone requiring right ureteral stent 1/4.  Underwent definitive stone therapy on 1/27.  No ureteral stone, foreign body or stent in place at this time.  He was seen by ID during that hospitalization recommended discontinuing antifungals at discharge.  Messaged Dr. Thedore Mins with ID who recommended following blood cultures and continuing Diflucan for now. -Continue Diflucan 200 mg daily -Follow-up blood cultures -Follow fever trend -Echocardiogram

## 2024-01-06 NOTE — Plan of Care (Signed)
 FMTS Brief Progress Note  S: Night round. Patient reports his weakness and dizziness has improved. No concerns tonight.    O: BP (!) 195/94 (BP Location: Right Arm)   Pulse 70   Temp 98.2 F (36.8 C)   Resp 20   Ht 5\' 7"  (1.702 m)   Wt 61.2 kg   SpO2 96%   BMI 21.14 kg/m   General: NAD, lying comfortably in hospital bed Neuro: A&O Cardiovascular: RRR, no murmurs, no peripheral edema Respiratory: normal WOB on RA, CTAB, no wheezes, ronchi or rales Extremities: Moving all 4 extremities equally   A/P: Fall  Possible syncope event Subjectively improved. BP elevated without symptoms. Continue work-up per H&P. -Orthostatics pending -ECHO pending -Follow-up bcx for fungemia  - Orders reviewed. Labs for AM ordered, which was adjusted as needed. Remainder of plan per day team.   Celine Mans, MD 01/06/2024, 8:57 PM PGY-2, [facility] Night Resident  Please page [phone] with questions.

## 2024-01-06 NOTE — ED Notes (Signed)
 Pt is being transported to MRI and

## 2024-01-06 NOTE — ED Notes (Addendum)
 This RN tried to ambulated patient and patient could not get from side of bed to seated position, pt reported dizziness and patient was very unstable and shaky.

## 2024-01-06 NOTE — Evaluation (Signed)
 Occupational Therapy Evaluation Patient Details Name: Jonathan Dawson MRN: [id number] DOB: Nov 21, 1940 Today's Date: 01/06/2024   History of Present Illness   Pt admitted via EMS on 01/06/24 d/t recent falls in home, wife reports decreased cog     Clinical Impressions Pt admitted based on above, and was seen based on problem list below. PTA pt was living with his spouse, driving, and was independent with ADLs and IADLs. Pt stating he has been receiving HHOT and HHPT 2x a week for the last 6 to 8 weeks. Today pt is requiring min assist for bed mobility and max assist with transfers. Pt presenting with decreased safety awareness, and functional limitations. Per family's request they do not want to d/c to <3 hours of skilled rehab, and would prefer continued HHOT. OT will continue to follow acutely to maximize functional independence.      If plan is discharge home, recommend the following:   A lot of help with walking and/or transfers;A lot of help with bathing/dressing/bathroom;Assistance with cooking/housework;Direct supervision/assist for medications management;Direct supervision/assist for financial management;Help with stairs or ramp for entrance;Supervision due to cognitive status     Functional Status Assessment   Patient has had a recent decline in their functional status and demonstrates the ability to make significant improvements in function in a reasonable and predictable amount of time.     Equipment Recommendations   BSC/3in1     Recommendations for Other Services         Precautions/Restrictions   Precautions Precautions: Fall Restrictions Weight Bearing Restrictions Per Provider Order: No     Mobility Bed Mobility Overal bed mobility: Needs Assistance Bed Mobility: Supine to Sit     Supine to sit: Min assist, HOB elevated, Used rails     General bed mobility comments: Cues for sequencing and problem solving    Transfers Overall transfer  level: Needs assistance Equipment used: Rolling walker (2 wheels) Transfers: Sit to/from Stand, Bed to chair/wheelchair/BSC Sit to Stand: Min assist, From elevated surface     Step pivot transfers: Max assist, From elevated surface     General transfer comment: Max steadying assist during pivot, posterior lean, cues for safety with sitting      Balance Overall balance assessment: Needs assistance, History of Falls Sitting-balance support: Bilateral upper extremity supported, Feet supported Sitting balance-Leahy Scale: Fair     Standing balance support: Bilateral upper extremity supported, Reliant on assistive device for balance Standing balance-Leahy Scale: Zero Standing balance comment: Heavy posterior lean while standing with RW                           ADL either performed or assessed with clinical judgement   ADL Overall ADL's : Needs assistance/impaired Eating/Feeding: Set up;Sitting   Grooming: Set up;Sitting           Upper Body Dressing : Set up;Sitting   Lower Body Dressing: Maximal assistance;Sit to/from stand Lower Body Dressing Details (indicate cue type and reason): Pt able to partially reach sock and doff with increased time, would require max A to stand Toilet Transfer: Maximal assistance;Stand-pivot;Rolling walker (2 wheels) Toilet Transfer Details (indicate cue type and reason): Simulated in room required max assist to steady during pivot                 Vision Baseline Vision/History: 1 Wears glasses Vision Assessment?: No apparent visual deficits     Perception         Praxis  Pertinent Vitals/Pain       Extremity/Trunk Assessment Upper Extremity Assessment Upper Extremity Assessment: Generalized weakness   Lower Extremity Assessment Lower Extremity Assessment: Defer to PT evaluation   Cervical / Trunk Assessment Cervical / Trunk Assessment: Normal   Communication Communication Communication: No apparent  difficulties   Cognition Arousal: Alert Behavior During Therapy: WFL for tasks assessed/performed Cognition: Cognition impaired   Orientation impairments: Time, Person, Place, Situation Awareness: Intellectual awareness intact, Online awareness impaired   Attention impairment (select first level of impairment): Selective attention Executive functioning impairment (select all impairments): Problem solving, Organization OT - Cognition Comments: Wife present but does not appear to be a reliant historian                 Following commands: Intact       Cueing  General Comments   Cueing Techniques: Verbal cues;Tactile cues  VS post mobility BP: 164/106, HR 80   Exercises     Shoulder Instructions      Home Living Family/patient expects to be discharged to:: Private residence Living Arrangements: Spouse/significant other Available Help at Discharge: Family Type of Home: House Home Access: Stairs to enter Secretary/administrator of Steps: 2 Entrance Stairs-Rails: Left Home Layout: One level     Bathroom Shower/Tub: Producer, television/film/video: Standard Bathroom Accessibility: No   Home Equipment: Rollator (4 wheels);Cane - single point;Shower seat - built in;Grab bars - tub/shower;Grab bars - toilet          Prior Functioning/Environment Prior Level of Function : Independent/Modified Independent             Mobility Comments: Rollator for walking, cane inhouse      OT Problem List: Decreased strength;Decreased activity tolerance;Decreased range of motion;Impaired balance (sitting and/or standing);Decreased cognition;Decreased knowledge of use of DME or AE;Decreased safety awareness   OT Treatment/Interventions: Self-care/ADL training;Therapeutic exercise;Therapeutic activities;Cognitive remediation/compensation;Patient/family education;Balance training      OT Goals(Current goals can be found in the care plan section)   Acute Rehab OT  Goals Patient Stated Goal: To get better OT Goal Formulation: With patient Time For Goal Achievement: 01/20/24 Potential to Achieve Goals: Good   OT Frequency:  Min 2X/week    Co-evaluation              AM-PAC OT "6 Clicks" Daily Activity     Outcome Measure Help from another person eating meals?: None Help from another person taking care of personal grooming?: A Little Help from another person toileting, which includes using toliet, bedpan, or urinal?: A Lot Help from another person bathing (including washing, rinsing, drying)?: A Lot Help from another person to put on and taking off regular upper body clothing?: A Little Help from another person to put on and taking off regular lower body clothing?: A Lot 6 Click Score: 16   End of Session Equipment Utilized During Treatment: Gait belt;Rolling walker (2 wheels) Nurse Communication: Mobility status  Activity Tolerance: Patient limited by fatigue Patient left: in chair;with call bell/phone within reach;with chair alarm set;with nursing/sitter in room;with family/visitor present  OT Visit Diagnosis: Unsteadiness on feet (R26.81);Other abnormalities of gait and mobility (R26.89);Repeated falls (R29.6);Muscle weakness (generalized) (M62.81);History of falling (Z91.81)                Time: 1191-4782 OT Time Calculation (min): 21 min Charges:  OT General Charges $OT Visit: 1 Visit OT Evaluation $OT Eval Moderate Complexity: 1 Mod  Ramyah Pankowski C, OT  Acute Rehabilitation Services Office [phone] Secure chat  preferred   Marilynne Drivers 01/06/2024, 3:26 PM

## 2024-01-06 NOTE — Assessment & Plan Note (Signed)
 Creatinine 1.33, around recent baseline of 1.2.-P1 0.5 L NS in ED. -Monitor in a.m. labs -Avoid nephrotoxic agents

## 2024-01-06 NOTE — ED Triage Notes (Addendum)
 Pt bib [facility] from home due to two falls Pt is on blood thinners. Pt had first fall around 630 pm yesterday 03/13 and was found outside laying on concrete by neighbor. Pt is not aware of what happened and is unsure if he hit head. Pt was not found  unconscious. When pt got inside, wife notice pt was not mentally baseline, pt then had another fall around 9pm and patient wife was going to see if patient could get up by self, the wife eventually called ems  , ems arrive around 0330 am 3/14. Pt is a&oX3, disoriented to time/ems. Wife reports patient is normally a&0X4 at baseline. Pt reports lower back pain and abrasions are noted on both elbows.   Wife can be contact at [phone]

## 2024-01-06 NOTE — H&P (Addendum)
 Hospital Admission History and Physical Service Pager: [phone]  Patient name: Azell Bill Medical record number: [id number] Date of Birth: February 02, 1941 Age: 83 y.o. Gender: male  Primary Care Provider: Patient, No Pcp Per Consultants: None Code Status: FULL  Preferred Emergency Contact:   Contact Information     Name Relation Home Work Mobile   Neth,Robin Spouse   [phone]      Other Contacts   None on File     Chief Complaint: Fall  Assessment and Plan: Hanan Moen is a 83 y.o. 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 Differential for this patient's presentation of this includes recurrent fungemia, DKA, and ACS Assessment & Plan Frequent falls Fall yesterday, unclear whether true syncopal event.  Did have lightheadedness when standing up and nausea. -Admit to FMTS, med telemetry, attending Dr. Manson Passey -Orthostatic vital signs -Cardiac monitoring -Fall precautions -PT/OT -S/p 1.5 L NS, reassess need for fluids -Echocardiogram -CBC, CMP, mag Emesis No abdominal pain or signs of obstruction on CT scan.  Possibly related to untreated fungemia -UA with budding yeast.  Does have elevated blood sugar and anion gap, negative urine ketones-will obtain VBG. -Zofran as needed -Troponin -VBG History of fungal infection [facility] hospitalization for fungemia complicated fungal urinary infection with acute obstruction ureteral stone requiring right ureteral stent 1/4.  Underwent definitive stone therapy on 1/27.  No ureteral stone, foreign body or stent in place at this time.  He was seen by ID during that hospitalization recommended  discontinuing antifungals at discharge.  Messaged Dr. Thedore Mins with ID who recommended following blood cultures and continuing Diflucan for now. -Continue Diflucan 200 mg daily -Follow-up blood cultures -Follow fever trend -Echocardiogram Type 2 diabetes mellitus with other specified complication (HCC) Elevated blood sugar in the 300s with anion gap of 15.  Home medications of Tresiba 28 units daily. -Start Lantus 25 units daily -sSSI while vomiting -Carb modified diet -VBG -Consider insulin drip if blood sugars remain elevated Hypertension 50s to 190s over 90s to 100s here.  Home medications of lisinopril 40 mg daily and Coreg 12.5 twice daily -Hold lisinopril in setting of emesis -Continue Coreg 12.5 mg twice daily CKD (chronic kidney disease) Creatinine 1.33, around recent baseline of 1.2.-P1 0.5 L NS in ED. -Monitor in a.m. labs -Avoid nephrotoxic agents Hypokalemia  3.0, repleted with 60 meq in ED. - Add on Mag - AM CMP  Chronic and Stable Conditions: Adenocarcinoma of the pancreas s/p Whipple  Prostate cancer with met to intra-abdominal lymph node-continue home Creon 24,000 units 3 times daily, continue GOC conversations BPH -continue tamsulosin 0.4 milligrams daily, consider discontinuing if orthostatics positive GERD-denies taking any reflux medications at this time Chronic anticoagulation d/t  h/o portacath thrombosis and SVC occlusion-continue Xarelto 20 mg daily, has had some missed doses Chronic compression fracture B12 deficiency-continue 1000 mcg daily Vit D deficiency-holding home weekly vitamin D Holding home vitamin C/iron deficiency - continue MVI in hospital  FEN/GI: Carb modified VTE Prophylaxis: Xarelto 20 mg daily  Disposition: Med-tele  History of Present Illness:  Master Touchet is a 83 y.o. male presenting with fall yesterday, weakness and vomiting.  Presents with a recent fall at 5 PM yesterday and subsequent weakness.  Reported feeling dizzy  and nauseous prior to the fall and has been vomiting since last night and being admitted to the hospital. Unsure of loss of consciousness per patient. States he may have hit his head (on Xarelto). The patient was unable to get up after the fall and remained on the ground - he states 3 hours however wife confirms it was 15 minutes. Wife states neighbor helped carry him in house. He ate dinner okay but had significant weakness getting to bathroom so wife called EMS.   The patient also reports increased urinary frequency and has an upcoming appointment to evaluate this issue. Denies urinary pain. The patient's wife reports that the patient's blood sugar levels have been high recently (300s but they have been elevated like this for awhile)  At baseline uses, cane or rollator. Bilious vomiting since being here  In the ED, CT head/cervical spine without acute intracranial or cervical spine injury, lumbar spine-remote L1 compression fracture, osteopenia, CT renal stone study-1 mm nonobstructing calculus in left vesicoureteral junction, moderate to severe wedging deformity of T12 vertebral body. WBC 14.1, ANC 11.9, K 3.0, CR 1.33, AG 15 alk phos 132, glucose 321, CK156, UA budding yeast negative ketones. Blood cultures obtained and started on diflucan.  Review Of Systems: Per HPI with the following additions: Denies headache, vision changes, fever, chest pain, shortness of breath, abdominal pain, increased diarrhea, constipation, dysuria, mild lower extremity swelling  + Vomiting, nausea, frequency  Pertinent Past Medical History: Remainder reviewed in history tab.   Pertinent Past Surgical History: Whipple  Remainder reviewed in history tab.   Pertinent Social History: Lives with wife  Pertinent Family History: Remainder reviewed in history tab.   Important Outpatient Medications: Tylenol as needed Zyrtec 10 mg PRN Flonase PRN Zofran 4 mg BID PRN Triamcinolone as needed Lisinopril 40 mg  daily Carvedilol 12.5 mg BID Creon with meals Potassium 10 meq BID Xarelto 20 mg daily--missed dose yesterday, none today but otherwise okay Tamsulosin 0.4 mg daily Tresiba 28 units a day B12 table 100 mcg daily Vit C-iron three times a week Vit D weekly Antifungal med was finished after last hospitalization--took it a week but not on it anymore  Remainder reviewed in medication history.   Objective: BP (!) 177/98   Pulse 80   Temp (!) 97.5 F (36.4 C) (Oral)   Resp 15   Ht 5\' 7"  (1.702 m)   Wt 61.2 kg   SpO2 99%   BMI 21.14 kg/m  Exam: General: NAD, awake, alert, responsive to questions  Eyes: Pupils equal and symmetric ENTM: Moist mucous membranes, bilious emesis around mouth Neck: No masses Cardiovascular: Regular rate and rhythm, no murmurs auscultated Respiratory: Clear to auscultation bilaterally, no wheezes rales or crackles, no increased work of breathing on room air Gastrointestinal: Soft, nontender to palpation, normoactive bowel sounds throughout, no suprapubic tenderness MSK: Trace edema in lower extremity, no calf tenderness Derm: Scattered ecchymosis on upper extremity/knees  Neuro: No focal deficits GU: (RN present as chaperone) no discharge, rash or lesions Psych: Mood appropriate, pleasant  Labs:  CBC BMET  Recent Labs  Lab 01/06/24 0622  WBC 14.1*  HGB 13.7  HCT 41.5  PLT 256   Recent Labs  Lab 01/06/24 0622  NA 141  K 3.0*  CL 106  CO2 20*  BUN 16  CREATININE 1.33*  GLUCOSE 321*  CALCIUM 9.1    Pertinent additional labs CK156, UA budding yeast .  EKG: ?bigeminy, bundle branch block no comparisons present  Imaging Studies Performed:  Imaging Study  CT head/cervical spine-no acute intracranial or cervical spine injury Lumbar spine-remote L1 compression fracture, osteopenia CT renal stone study-1 mm nonobstructing calculus in left vesicoureteral junction, moderate to severe wedging deformity of T12 vertebral body CXR-no acute  abnormalities on my interpretation   Levin Erp, MD 01/06/2024, 11:25 AM PGY-3, [facility] [facility]  FPTS Intern pager: [phone], text pages welcome Secure chat group [facility] Teaching Service

## 2024-01-06 NOTE — ED Provider Notes (Signed)
 [place] EMERGENCY DEPARTMENT AT [facility] Provider Note   CSN: [id number] Arrival date & time: 01/06/24  0865     History  Chief Complaint  Patient presents with   Fall   FOT    Jabe Alexandria Current is a 83 y.o. male.  HPI     This is an 83 year old male who presents after fall.  Patient reports that he felt dizzy and reportedly initially fell outside yesterday at 6:30 PM.  He was found by a neighbor.  He is oriented to himself and place but not time.  Wife reported that normally he is oriented fully.  He fell again at home around 9 PM.  He is unable to get up before.  He states he laid in the floor for several hours.  Wife called EMS.  He is reporting lower back pain.  States that he is on a blood thinner but cannot tell me which one.  Not listed in his current med rec.  Home Medications Prior to Admission medications   Medication Sig Start Date End Date Taking? Authorizing Provider  acetaminophen (TYLENOL) 325 MG tablet Take 325-650 mg by mouth 2 (two) times daily as needed for mild pain.    [provider]  aspirin 81 MG tablet Take 81 mg by mouth See admin instructions. Takes a couple times a month    [provider]  cetirizine (ZYRTEC) 10 MG tablet Take 10 mg by mouth daily as needed for allergies.    [provider]  clobetasol cream (TEMOVATE) 0.05 % Apply 1 application topically 4 (four) times daily as needed. Spots on arms and legs 06/08/16   [provider]  fluticasone (FLONASE) 50 MCG/ACT nasal spray Place 2 sprays into both nostrils daily. Patient taking differently: Place 2 sprays into both nostrils daily as needed for allergies.  07/18/14   Laren Boom, DO  Investigational - Study Medication Take 500 mg by mouth 2 (two) times daily. INV-nicotinamide riboside (INVESTIGATIONAL STUDY) 250 mg capsule    [provider]  ipratropium (ATROVENT) 0.06 % nasal spray Place 1 spray into both nostrils 2 (two) times daily  as needed for rhinitis.    [provider]  LEVITRA 20 MG tablet TAKE ONE-FOURTH TO ONE-HALF TABLET BY MOUTH AS NEEDED Patient taking differently: TAKE ONE-FOURTH TO ONE-HALF TABLET BY MOUTH AS NEEDED FOR ERECTILE DYSFUNCTION 10/21/15   Laren Boom, DO  omeprazole (PRILOSEC) 40 MG capsule Take 40 mg by mouth 2 (two) times daily.    [provider]  ondansetron (ZOFRAN) 4 MG tablet Take 4 mg by mouth 2 (two) times daily as needed for nausea/vomiting. 03/08/17   [provider]  oxybutynin (DITROPAN) 5 MG tablet Take 5 mg by mouth daily. 01/10/17   [provider]  ranitidine (ZANTAC) 150 MG tablet Take 150 mg by mouth 2 (two) times daily.    [provider]  triamcinolone cream (KENALOG) 0.1 % Apply topically 2 (two) times daily. Patient taking differently: Apply 1 application topically 2 (two) times daily.  12/20/12   Laren Boom, DO      Allergies    Patient has no known allergies.    Review of Systems   Review of Systems  Respiratory:  Negative for shortness of breath.   Cardiovascular:  Negative for chest pain.  Gastrointestinal:  Negative for abdominal pain.  Musculoskeletal:  Positive for back pain.  Neurological:  Positive for light-headedness and headaches.  All other systems reviewed and are negative.  Physical Exam Updated Vital Signs BP (!) 146/84   Pulse (!) 110   Temp 97.9 F (36.6 C) (Oral)   Resp 19   Ht 1.702 m (5\' 7" )   Wt 61.2 kg   SpO2 98%   BMI 21.14 kg/m  Physical Exam Vitals and nursing note reviewed.  Constitutional:      Appearance: He is well-developed.     Comments: Elderly, chronically ill-appearing, no acute distress  HENT:     Head: Normocephalic and atraumatic.     Nose: Nose normal.     Mouth/Throat:     Mouth: Mucous membranes are dry.  Eyes:     Pupils: Pupils are equal, round, and reactive to light.  Neck:     Comments: C-collar in place Cardiovascular:     Rate and Rhythm: Normal rate and  regular rhythm.     Heart sounds: Normal heart sounds. No murmur heard. Pulmonary:     Effort: Pulmonary effort is normal. No respiratory distress.     Breath sounds: Normal breath sounds. No wheezing.  Abdominal:     General: Bowel sounds are normal.     Palpations: Abdomen is soft.     Tenderness: There is no abdominal tenderness. There is no rebound.  Musculoskeletal:        General: No deformity.     Comments: Tenderness to palpation lower lumbar spine without step-off or deformity noted  Skin:    General: Skin is warm and dry.  Neurological:     Mental Status: He is alert.     Comments: Oriented to person place not time.  Follows directions, moves all 4 extremities equally  Psychiatric:        Mood and Affect: Mood normal.     ED Results / Procedures / Treatments   Labs (all labs ordered are listed, but only abnormal results are displayed) Labs Reviewed  CBC WITH DIFFERENTIAL/PLATELET  COMPREHENSIVE METABOLIC PANEL  CK  URINALYSIS, ROUTINE W REFLEX MICROSCOPIC    EKG None  Radiology No results found.  Procedures Procedures    Medications Ordered in ED Medications - No data to display  ED Course/ Medical Decision Making/ A&P                                 Medical Decision Making Amount and/or Complexity of Data Reviewed Labs: ordered. Radiology: ordered.  Risk Prescription drug management. Decision regarding hospitalization.   This patient presents to the ED for concern of fall, ams, this involves an extensive number of treatment options, and is a complaint that carries with it a high risk of complications and morbidity.  I considered the following differential and admission for this acute, potentially life threatening condition.  The differential diagnosis includes acute traumatic injury, metabolic derangement, infectious etiology such as UTI  MDM:    This is an 83 year old male who presents with 2 falls.  He is nontoxic-appearing.  Not quite at  his baseline and is disoriented.  He is able to provide a history.  He is not febrile.  Vital signs are reassuring.  Physical exam is fairly benign.  Neurologically appears to be intact.  CT head does not appear to show any evidence of acute traumatic injury.  Basic labs including metabolic panel and urinalysis obtained but still pending at time of signout.  (Labs, imaging, consults)  Labs: I Ordered, and personally interpreted labs.  The pertinent results include: CBC, BMP,  urinalysis  Imaging Studies ordered: I ordered imaging studies including CT head I independently visualized and interpreted imaging. I agree with the radiologist interpretation  Additional history obtained from chart review and EMS.  External records from outside source obtained and reviewed including prior evaluations  Cardiac Monitoring: The patient was maintained on a cardiac monitor.  If on the cardiac monitor, I personally viewed and interpreted the cardiac monitored which showed an underlying rhythm of: Sinus  Reevaluation: After the interventions noted above, I reevaluated the patient and found that they have :stayed the same  Social Determinants of Health:  lives with wife  Disposition: Pending, signed out to oncoming provider  Co morbidities that complicate the patient evaluation  Past Medical History:  Diagnosis Date   Allergy    Asthma    Cancer (HCC)    skin cancer- leg   Pancreatic cancer (HCC)      Medicines Meds ordered this encounter  Medications   sodium chloride 0.9 % bolus 500 mL   potassium chloride (KLOR-CON) packet 60 mEq   fluconazole (DIFLUCAN) tablet 150 mg   ondansetron (ZOFRAN) injection 4 mg   sodium chloride 0.9 % bolus 1,000 mL   fluconazole (DIFLUCAN) tablet 200 mg   fluconazole (DIFLUCAN) tablet 50 mg   acetaminophen (TYLENOL) tablet 650 mg   ondansetron (ZOFRAN) injection 4 mg   DISCONTD: lisinopril (ZESTRIL) tablet 40 mg   carvedilol (COREG) tablet 12.5 mg    lipase/protease/amylase (CREON) capsule 24,000 Units   tamsulosin (FLOMAX) capsule 0.4 mg   insulin glargine (LANTUS) injection 25 Units   cyanocobalamin (VITAMIN B12) tablet 1,000 mcg   multivitamin with minerals tablet 1 tablet   rivaroxaban (XARELTO) tablet 20 mg   insulin aspart (novoLOG) injection 0-9 Units    Correction coverage::   Sensitive (thin, NPO, renal)    CBG < 70::   Implement Hypoglycemia Standing Orders and refer to Hypoglycemia Standing Orders sidebar report    CBG 70 - 120::   0 units    CBG 121 - 150::   1 unit    CBG 151 - 200::   2 units    CBG 201 - 250::   3 units    CBG 251 - 300::   5 units    CBG 301 - 350::   7 units    CBG 351 - 400:   9 units    CBG > 400:   call MD and obtain STAT lab verification   calcium-vitamin D (OSCAL WITH D) 500-5 MG-MCG per tablet 2 tablet    I have reviewed the patients home medicines and have made adjustments as needed  Problem List / ED Course: Problem List Items Addressed This Visit   None               Final Clinical Impression(s) / ED Diagnoses Final diagnoses:  None    Rx / DC Orders ED Discharge Orders     None         Shon Baton, MD 01/06/24 2328

## 2024-01-06 NOTE — Plan of Care (Signed)

## 2024-01-06 NOTE — Assessment & Plan Note (Signed)
 50s to 190s over 90s to 100s here.  Home medications of lisinopril 40 mg daily and Coreg 12.5 twice daily -Hold lisinopril in setting of emesis -Continue Coreg 12.5 mg twice daily

## 2024-01-06 NOTE — Inpatient Diabetes Management (Signed)
 Inpatient Diabetes Program Recommendations  AACE/ADA: New Consensus Statement on Inpatient Glycemic Control (2015)  Target Ranges:  Prepandial:   less than 140 mg/dL      Peak postprandial:   less than 180 mg/dL (1-2 hours)      Critically ill patients:  140 - 180 mg/dL   Lab Results  Component Value Date   GLUCAP 187 (H) 01/06/2024    Review of Glycemic Control  Diabetes history: s/p whipple procedure 6 years ago at [facility] DM type 2 listed in the chart Outpatient Diabetes medications: Tresiba 28 units (24 units per last PCP note) Current orders for Inpatient glycemic control:  Lantus 25 units Daily Novolog 0-9 units tid  PCP: Wilfred Curtis, last visit less than 1 month ago on 2/24 A1c 12% on 2/24 at last PCP visit  Watch on current insulin regimen. Based on uncontrolled glucose at home and history of whipple procedure (oftentimes causes pt to develop type 1 diabetes), may want to start Short acting insulin at home and start CGM monitoring. May need a correction scale and/or meal coverage insulin at home.  Novolog/Humalog $35 Freestyle Libre 3 $0 copay  Thanks,  Christena Deem RN, MSN, BC-ADM Inpatient Diabetes Coordinator Team Pager [phone] (8a-5p)

## 2024-01-06 NOTE — ED Provider Notes (Signed)
 Received patient in turnover from Dr. Wilkie Aye.  Please see their note for further details of Hx, PE.  Briefly patient is a 83 y.o. male with a Fall and FOT .  Patient suffered two falls.  Not at his baseline.  Plan for labs, UA, fluids and ambulate.  Patient's metabolic panel has resulted with mild hyperglycemia hypokalemia.  Patient's renal function is slightly higher than baseline.  On my record review, last check in the [facility] system looks like his baseline creatinine is about 1.1.  Further on record review the patient has been recently hospitalized a couple times in the [place] system, was noted to have fungemia, of likely urinary source had a obstructing renal stone that required stent placement.  I had a discussion with the patient.  He thinks that the fungal infection is much improved.  Patient's UA does have yeast.  Large leukocyte esterase too numerous count whites.  Will give a dose of Diflucan here.  Patient started having nausea and vomiting.  Will give antiemetics.  CT stone study.  Stone study without obvious acute finding.  Unfortunately on repeat assessment patient unable to walk still feels too weak to get up.  Discussed case with our pharmacist here.  Will evaluate for possible antifungal therapy.  Discussed with medicine for admission.  CRITICAL CARE Performed by: Rae Roam   Total critical care time: 35 minutes  Critical care time was exclusive of separately billable procedures and treating other patients.  Critical care was necessary to treat or prevent imminent or life-threatening deterioration.  Critical care was time spent personally by me on the following activities: development of treatment plan with patient and/or surrogate as well as nursing, discussions with consultants, evaluation of patient's response to treatment, examination of patient, obtaining history from patient or surrogate, ordering and performing treatments and interventions, ordering and  review of laboratory studies, ordering and review of radiographic studies, pulse oximetry and re-evaluation of patient's condition.            Melene Plan, DO 01/06/24 1145

## 2024-01-07 ENCOUNTER — Inpatient Hospital Stay (HOSPITAL_COMMUNITY)

## 2024-01-07 DIAGNOSIS — R55 Syncope and collapse: Secondary | ICD-10-CM

## 2024-01-07 DIAGNOSIS — Z789 Other specified health status: Secondary | ICD-10-CM

## 2024-01-07 DIAGNOSIS — R296 Repeated falls: Secondary | ICD-10-CM

## 2024-01-07 LAB — GLUCOSE, CAPILLARY
Glucose-Capillary: 49 mg/dL — ABNORMAL LOW (ref 70–99)
Glucose-Capillary: 189 mg/dL — ABNORMAL HIGH (ref 70–99)
Glucose-Capillary: 263 mg/dL — ABNORMAL HIGH (ref 70–99)
Glucose-Capillary: 258 mg/dL — ABNORMAL HIGH (ref 70–99)
Glucose-Capillary: 165 mg/dL — ABNORMAL HIGH (ref 70–99)
Glucose-Capillary: 191 mg/dL — ABNORMAL HIGH (ref 70–99)
Glucose-Capillary: 178 mg/dL — ABNORMAL HIGH (ref 70–99)

## 2024-01-07 LAB — COMPREHENSIVE METABOLIC PANEL
ALT: 21 U/L (ref 0–44)
AST: 19 U/L (ref 15–41)
Albumin: 2.8 g/dL — ABNORMAL LOW (ref 3.5–5.0)
Alkaline Phosphatase: 94 U/L (ref 38–126)
Anion gap: 10 (ref 5–15)
BUN: 16 mg/dL (ref 8–23)
CO2: 24 mmol/L (ref 22–32)
Calcium: 8.1 mg/dL — ABNORMAL LOW (ref 8.9–10.3)
Chloride: 103 mmol/L (ref 98–111)
Creatinine, Ser: 1.25 mg/dL — ABNORMAL HIGH (ref 0.61–1.24)
GFR, Estimated: 57 mL/min — ABNORMAL LOW (ref >60)
Glucose, Bld: 311 mg/dL — ABNORMAL HIGH (ref 70–99)
Potassium: 3.9 mmol/L (ref 3.5–5.1)
Sodium: 137 mmol/L (ref 135–145)
Total Bilirubin: 0.6 mg/dL (ref 0.0–1.2)
Total Protein: 5.5 g/dL — ABNORMAL LOW (ref 6.5–8.1)

## 2024-01-07 LAB — CBC
HCT: 36.6 % — ABNORMAL LOW (ref 39.0–52.0)
Hemoglobin: 11.9 g/dL — ABNORMAL LOW (ref 13.0–17.0)
MCH: 29.9 pg (ref 26.0–34.0)
MCHC: 32.5 g/dL (ref 30.0–36.0)
MCV: 92 fL (ref 80.0–100.0)
Platelets: 225 10*3/uL (ref 150–400)
RBC: 3.98 MIL/uL — ABNORMAL LOW (ref 4.22–5.81)
RDW: 14.6 % (ref 11.5–15.5)
WBC: 12 10*3/uL — ABNORMAL HIGH (ref 4.0–10.5)
nRBC: 0 % (ref 0.0–0.2)

## 2024-01-07 LAB — ECHOCARDIOGRAM COMPLETE
Area-P 1/2: 2.19 cm2
Height: 67 in
S' Lateral: 2.3 cm
Weight: 2160 [oz_av]

## 2024-01-07 LAB — MAGNESIUM: Magnesium: 2.2 mg/dL (ref 1.7–2.4)

## 2024-01-07 MED ORDER — DEXTROSE 50 % IV SOLN
1.0000 | Freq: Once | INTRAVENOUS | Status: AC
  Administered 2024-01-07: 50 mL via INTRAVENOUS
  Filled 2024-01-07: qty 50

## 2024-01-07 MED ORDER — INSULIN GLARGINE 100 UNIT/ML ~~LOC~~ SOLN
15.0000 [IU] | Freq: Every day | SUBCUTANEOUS | Status: DC
  Administered 2024-01-07 – 2024-01-09 (×3): 15 [IU] via SUBCUTANEOUS
  Filled 2024-01-07 (×3): qty 0.15

## 2024-01-07 MED ORDER — PERFLUTREN LIPID MICROSPHERE
1.0000 mL | INTRAVENOUS | Status: AC | PRN
  Administered 2024-01-07: 2 mL via INTRAVENOUS

## 2024-01-07 NOTE — Plan of Care (Signed)

## 2024-01-07 NOTE — Plan of Care (Signed)
 CBG 49 noted. D50 1 amp IV ordered, RN notified. CBG recheck 30 minutes after amp. Decreased LAI to 15 units.

## 2024-01-07 NOTE — Assessment & Plan Note (Addendum)
 Resolved today. - Zofran as needed

## 2024-01-07 NOTE — Evaluation (Signed)
 Physical Therapy Evaluation Patient Details Name: Jonathan Dawson MRN: [id number] DOB: [birth year] Today's Date: 01/07/2024  History of Present Illness  83 y/o male admitted via EMS on 01/06/24 d/t recent falls in home, wife reports decreased cog. PMH  includes uncontrolled DM, CKD 3A, prostate cancer with lymph node  (follows with oncology, on ADT with Lupron, due in April), pancreatitic cancer, hx of SV syndrome (had port a cath in at that time and was non compliant with AC)--on [place] chronically  Clinical Impression  Patient lives at home with his wife an is independent with ADLs/IADLs. Patient presents to [facility] for repeated falls, and has signs and symptoms consistent with orthostatic hypotension. Vitals were assessed throughout the session: BP Vitals: Supine 137/87 (103); Seated 150/95 (111); Standing 138/85 (102); Seated on Toilet w/ reports of dizziness 168/92 (112). Patient is independent for bed mobility and requires MinA for sit > stand transfer to assist with ascension. Patient is able to ambulate 167ft with CGA and no seated rest breaks. Acute PT will continue to follow up to practice and improve patient's tolerance with stair negotiation, transfers, and gait. HHPT is also recommended to maximize the patient's level of independence. Thank you for this consult.       If plan is discharge home, recommend the following: A little help with walking and/or transfers;Assist for transportation;Help with stairs or ramp for entrance   Can travel by private vehicle        Equipment Recommendations None recommended by PT  Recommendations for Other Services       Functional Status Assessment Patient has had a recent decline in their functional status and demonstrates the ability to make significant improvements in function in a reasonable and predictable amount of time.     Precautions / Restrictions Precautions Precautions: Fall;Other (comment) (Orthostatic hypotension) Recall of  Precautions/Restrictions: Intact Restrictions Weight Bearing Restrictions Per Provider Order: No      Mobility  Bed Mobility Overal bed mobility: Independent                  Transfers Overall transfer level: Needs assistance Equipment used: Rolling walker (2 wheels) Transfers: Sit to/from Stand, Bed to chair/wheelchair/BSC Sit to Stand: Min assist           General transfer comment: Min A to assist with elevating from a seated position, CGA for stand > sit    Ambulation/Gait Ambulation/Gait assistance: Contact guard assist Gait Distance (Feet): 100 Feet Assistive device: Rolling walker (2 wheels) Gait Pattern/deviations: Step-through pattern Gait velocity: Decreased     General Gait Details: No reports of lightheadedness, SOB, dizziness, or blurred vision  Stairs            Wheelchair Mobility     Tilt Bed    Modified Rankin (Stroke Patients Only)       Balance Overall balance assessment: Needs assistance, History of Falls Sitting-balance support: Feet supported Sitting balance-Leahy Scale: Good     Standing balance support: Bilateral upper extremity supported, Reliant on assistive device for balance Standing balance-Leahy Scale: Fair Standing balance comment: No signs of unsteadiness or orthostatic hypotension                             Pertinent Vitals/Pain Pain Assessment Pain Assessment: No/denies pain    Home Living Family/patient expects to be discharged to:: Private residence Living Arrangements: Spouse/significant other Available Help at Discharge: Family Type of Home: House Home Access: Stairs to enter  Entrance Stairs-Rails: Left Entrance Stairs-Number of Steps: 2   Home Layout: One level Home Equipment: Rollator (4 wheels);Cane - single point;Shower seat - built in;Grab bars - tub/shower;Grab bars - toilet      Prior Function Prior Level of Function : Independent/Modified Independent              Mobility Comments: Rollator for walking, cane in house       Extremity/Trunk Assessment   Upper Extremity Assessment Upper Extremity Assessment: Defer to OT evaluation    Lower Extremity Assessment Lower Extremity Assessment: Overall WFL for tasks assessed    Cervical / Trunk Assessment Cervical / Trunk Assessment: Normal  Communication   Communication Communication: No apparent difficulties    Cognition Arousal: Alert Behavior During Therapy: WFL for tasks assessed/performed   PT - Cognitive impairments: No apparent impairments                         Following commands: Intact       Cueing Cueing Techniques: Verbal cues, Tactile cues     General Comments General comments (skin integrity, edema, etc.): BP Vitals: Supine 137/87 (103); Seated 150/95 (111); Standing 138/85 (102); Seated on Toilet w/ reports of dizziness 168/92 (112)    Exercises     Assessment/Plan    PT Assessment Patient needs continued PT services  PT Problem List Decreased activity tolerance;Decreased balance;Decreased coordination;Decreased mobility;Cardiopulmonary status limiting activity       PT Treatment Interventions DME instruction;Gait training;Stair training;Therapeutic activities;Functional mobility training;Balance training    PT Goals (Current goals can be found in the Care Plan section)  Acute Rehab PT Goals Patient Stated Goal: Return to PLOF PT Goal Formulation: With patient Time For Goal Achievement: 01/21/24 Potential to Achieve Goals: Good    Frequency Min 2X/week     Co-evaluation               AM-PAC PT "6 Clicks" Mobility  Outcome Measure Help needed turning from your back to your side while in a flat bed without using bedrails?: None Help needed moving from lying on your back to sitting on the side of a flat bed without using bedrails?: None Help needed moving to and from a bed to a chair (including a wheelchair)?: A Little Help needed  standing up from a chair using your arms (e.g., wheelchair or bedside chair)?: A Little Help needed to walk in hospital room?: A Little Help needed climbing 3-5 steps with a railing? : A Lot 6 Click Score: 19    End of Session Equipment Utilized During Treatment: Gait belt Activity Tolerance: Patient tolerated treatment well Patient left: in chair;with call bell/phone within reach;with chair alarm set;with family/visitor present   PT Visit Diagnosis: Other abnormalities of gait and mobility (R26.89);History of falling (Z91.81)    Time: 1525-1550 PT Time Calculation (min) (ACUTE ONLY): 25 min   Charges:   PT Evaluation $PT Eval Low Complexity: 1 Low PT Treatments $Therapeutic Activity: 8-22 mins PT General Charges $$ ACUTE PT VISIT: 1 Visit         Doreen Beam, SPT   Vaniyah Lansky 01/07/2024, 4:16 PM

## 2024-01-07 NOTE — Assessment & Plan Note (Addendum)
 Creatinine 1.25 (baseline of 1.2) -Continue to monitor -Avoid nephrotoxic agents

## 2024-01-07 NOTE — Progress Notes (Signed)
 Hypoglycemic Event  CBG: 49  Treatment: 4 oz juice/soda and D50 50 mL (25 gm)  Symptoms: None  Follow-up CBG: Time:0748 CBG Result:263  Possible Reasons for Event: Unknown  Comments/MD notified:Shitarev Dimitry MD. See new orders. Administered    Teola Bradley Elmer Merwin

## 2024-01-07 NOTE — Assessment & Plan Note (Addendum)
 Patient feels well today, OT recommends HH.  Suspect multifactorial in setting of possible hypoglycemia, dehydration (2/2 emesis).  ACS workup unremarkable. Pending complete workup as below. - Pending left side vital signs - Pending echocardiogram - Pending PT eval - P.o. fluids - Cardiac monitoring - Consider discontinue tamsulosin if orthostatics positive - AM CBC, BMP

## 2024-01-07 NOTE — Assessment & Plan Note (Addendum)
 Blood pressure continues to fluctuate between soft and hypertensive. - Hold lisinopril in setting of emesis - Continue Coreg 12.5 mg twice daily

## 2024-01-07 NOTE — Assessment & Plan Note (Addendum)
 Episode of hypoglycemia to 49 this a.m, repeat 189 after amp of D50.  Documented 100% dinner eaten, however patient states he did not eat his dinner.  Unfortunately, patient states he has not been taking his blood glucose at home, takes 28 units of [place] daily at home.  Additionally, on carb modified diet here but not restricting at home. Question if falls may be related to hypoglycemia. - Decrease Lantus to 15 units daily - SSI, monitor CBGs - Carb modified diet

## 2024-01-07 NOTE — Assessment & Plan Note (Addendum)
 Resolved

## 2024-01-07 NOTE — Progress Notes (Signed)
 Daily Progress Note Intern Pager: [phone]  Patient name: Jonathan Dawson Medical record number: [id number] Date of birth: November 18, 1940 Age: 83 y.o. 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  Falls likely multi factorial, emesis has improved, see below for details.  Noted to have mild pitting edema bilateral lower extremities, pending echo cardiogram today. Assessment & Plan Frequent falls Patient feels well today, OT recommends HH.  Suspect multifactorial in setting of possible hypoglycemia, dehydration (2/2 emesis).  ACS workup unremarkable. Pending complete workup as below. - Pending left side vital signs - Pending echocardiogram - Pending PT eval - P.o. fluids - Cardiac monitoring - Consider discontinue tamsulosin if orthostatics positive - AM CBC, BMP Emesis Resolved today. - Zofran as needed History of fungal infection Afebrile, blood cultures NGTD (24 hrs) -Continue Diflucan 200 mg daily -Follow-up blood cultures -Follow fever trend -Echocardiogram Type 2 diabetes mellitus with other specified complication (HCC) Episode of hypoglycemia to 49 this a.m, repeat 189 after amp of D50.  Documented 100% dinner eaten, however patient states he did not eat his dinner.  Unfortunately, patient states he has not been taking his blood glucose at home, takes 28 units of [place] daily at home.  Additionally, on carb modified diet here but not restricting at home. Question if falls may be  related to hypoglycemia. - Decrease Lantus to 15 units daily - SSI, monitor CBGs - Carb modified diet Hypertension Blood pressure continues to fluctuate between soft and hypertensive. - Hold lisinopril in setting of emesis - Continue Coreg 12.5 mg twice daily CKD (chronic kidney disease) Creatinine 1.25 (baseline of 1.2) - Continue to monitor - Avoid nephrotoxic agents Hypokalemia Resolved. Chronic health problem Hx of Pancreatic cancer s/p Whipple: Continue home Creon 24,000 U BPH: Tamsulosin (pending orthostatics) GERD: Consider medication if patient develops symptoms History of Port-A-Cath neurosis and SVC occlusion: Continue Xarelto 20 mg daily Vitamin deficiency: Continue B12, held weekly vitamin D (consider restarting if due for dose)  FEN/GI: Carb modified PPx: Xarelto Dispo:Pending PT recommendations  pending clinical improvement .   Subjective:   No acute concerns today.  States he did not feel symptomatic when his CBG was 49.  Additionally, admits to not checking blood sugars daily.  Nausea resolved.  Objective: Temp:  [97.4 F (36.3 C)-98.2 F (36.8 C)] 97.5 F (36.4 C) (03/15 0904) Pulse Rate:  [54-82] 80 (03/15 0932) Resp:  [14-20] 16 (03/15 0904) BP: (112-202)/(69-106) 202/106 (03/15 0932) SpO2:  [96 %-100 %] 96 % (03/15 0932) Physical Exam: General: NAD, chronically ill-appearing Cardiovascular: RRR, no murmurs Respiratory: CTAB: Normal work of breathing on room air Abdomen: Soft, nontender, not distended Extremities: Moving all 4 extremities, +1 pitting edema bilaterally  Laboratory: Most recent CBC Lab Results  Component Value Date   WBC 12.0 (H) 01/07/2024   HGB 11.9 (L) 01/07/2024   HCT 36.6 (L) 01/07/2024   MCV 92.0 01/07/2024   PLT 225 01/07/2024   Most recent BMP    Latest Ref Rng & Units 01/07/2024    7:49 AM  BMP  Glucose 70 -  99 mg/dL 756   BUN 8 - 23 mg/dL 16   Creatinine 4.33 - 1.24 mg/dL 2.95   Sodium 188 - 416 mmol/L 137    Potassium 3.5 - 5.1 mmol/L 3.9   Chloride 98 - 111 mmol/L 103   CO2 22 - 32 mmol/L 24   Calcium 8.9 - 10.3 mg/dL 8.1     Tiffany Kocher, DO 01/07/2024, 9:36 AM  PGY-2, [place] [facility] FPTS Intern pager: [phone], text pages welcome Secure chat group [facility] Teaching Service

## 2024-01-07 NOTE — Assessment & Plan Note (Addendum)
 Afebrile, blood cultures NGTD (24 hrs) -Continue Diflucan 200 mg daily -Follow-up blood cultures -Follow fever trend -Echocardiogram

## 2024-01-07 NOTE — Assessment & Plan Note (Signed)
 Hx of Pancreatic cancer s/p Whipple: Continue home Creon 24,000 U BPH: Tamsulosin (pending orthostatics) GERD: Consider medication if patient develops symptoms History of Port-A-Cath neurosis and SVC occlusion: Continue Xarelto 20 mg daily Vitamin deficiency: Continue B12, held weekly vitamin D (consider restarting if due for dose)

## 2024-01-08 DIAGNOSIS — R296 Repeated falls: Secondary | ICD-10-CM | POA: Diagnosis not present

## 2024-01-08 LAB — CBC
HCT: 34.5 % — ABNORMAL LOW (ref 39.0–52.0)
Hemoglobin: 11.2 g/dL — ABNORMAL LOW (ref 13.0–17.0)
MCH: 29.7 pg (ref 26.0–34.0)
MCHC: 32.5 g/dL (ref 30.0–36.0)
MCV: 91.5 fL (ref 80.0–100.0)
Platelets: 219 10*3/uL (ref 150–400)
RBC: 3.77 MIL/uL — ABNORMAL LOW (ref 4.22–5.81)
RDW: 14.6 % (ref 11.5–15.5)
WBC: 11.6 10*3/uL — ABNORMAL HIGH (ref 4.0–10.5)
nRBC: 0 % (ref 0.0–0.2)

## 2024-01-08 LAB — BASIC METABOLIC PANEL
Anion gap: 9 (ref 5–15)
BUN: 20 mg/dL (ref 8–23)
CO2: 25 mmol/L (ref 22–32)
Calcium: 8.4 mg/dL — ABNORMAL LOW (ref 8.9–10.3)
Chloride: 106 mmol/L (ref 98–111)
Creatinine, Ser: 1.29 mg/dL — ABNORMAL HIGH (ref 0.61–1.24)
GFR, Estimated: 55 mL/min — ABNORMAL LOW (ref >60)
Glucose, Bld: 85 mg/dL (ref 70–99)
Potassium: 3.9 mmol/L (ref 3.5–5.1)
Sodium: 140 mmol/L (ref 135–145)

## 2024-01-08 LAB — GLUCOSE, CAPILLARY
Glucose-Capillary: 118 mg/dL — ABNORMAL HIGH (ref 70–99)
Glucose-Capillary: 215 mg/dL — ABNORMAL HIGH (ref 70–99)
Glucose-Capillary: 149 mg/dL — ABNORMAL HIGH (ref 70–99)
Glucose-Capillary: 182 mg/dL — ABNORMAL HIGH (ref 70–99)

## 2024-01-08 MED ORDER — NYSTATIN 100000 UNIT/GM EX POWD: CUTANEOUS | Status: DC | PRN

## 2024-01-08 NOTE — Assessment & Plan Note (Addendum)
 Fluctuating BPs. -Continue home Coreg 12.5 mg twice daily -Holding lisinopril.  Still have some soft BPs

## 2024-01-08 NOTE — Assessment & Plan Note (Addendum)
 Suspected to be multifactorial likely due to dehydration versus hypoglycemic episodes.  No falls since admission on and patient's endorses regaining strength. Still needing to ambulate with walker. Will continue PT/OT qhilw admitted  -Fall precautions -Continue PT/OT -Continuous cardiac monitoring -Morning CBC, BMP. -We will consider discontinuing tamsulosin due to orthostatics

## 2024-01-08 NOTE — Progress Notes (Addendum)
     Daily Progress Note Intern Pager: [phone]  Patient name: Jonathan Dawson Medical record number: [id number] Date of birth: [birth year] Age: 83 y.o. Gender: male  Primary Care Provider: Patient, No Pcp Per Consultants: None Code Status: Full  Pt Overview and Major Events to Date:  3/14: Admitted  Assessment and Plan: Jonathan Dawson is an 83 year old male with PMH of adenocarcinoma of the pancreas s/p a week for 2019, T2DM, HTN, BPH, GERD, CKD 3A and recent hospitalist for fungemia admitted due to frequent falls suspected to be due to hypoglycemia or dehydration.  Assessment & Plan Frequent falls Suspected to be multifactorial likely due to dehydration versus hypoglycemic episodes.  No falls since admission on and patient's endorses regaining strength. Still needing to ambulate with walker. Will continue PT/OT qhilw admitted  -Fall precautions -Continue PT/OT -Continuous cardiac monitoring -Morning CBC, BMP. -We will consider discontinuing tamsulosin due to orthostatics History of fungal infection Has remained afebrile and still no growth with blood culture.  -Per ID we will continue oral Diflucan for 14 days, last date 3/27. -For follow-up blood culture -Monitor fevers with routine vitals.  Type 2 diabetes mellitus with other specified complication (HCC) Brittle blood sugars despite reduced dose while admitted. -Continue Lantus 15 units daily -SSI, with CBG monitoring -On Carb Modified diet  Hypertension Fluctuating BPs. -Continue home Coreg 12.5 mg twice daily -Holding lisinopril.  Still have some soft BPs  Chronic health problem Hx of Pancreatic cancer s/p Whipple: Continue home Creon 24,000 U BPH: Tamsulosin  GERD: Consider medication if patient develops symptoms History of Port-A-Cath neurosis and SVC occlusion: Continue Xarelto 20 mg daily Vitamin deficiency: Continue B12, held weekly vitamin D (consider restarting if due for dose)   FEN/GI: Carb  modified PPx: Xarelto Dispo:Home with home health .Barriers include still needing assistance with ambulation.   Subjective:  Patient awake and laying comfortably in bed.  Endorses feeling much better from the time of his admission. No falls or dizziness  Objective: Temp:  [97.3 F (36.3 C)-99.4 F (37.4 C)] 97.6 F (36.4 C) (03/16 1409) Pulse Rate:  [59-75] 70 (03/16 1409) Resp:  [15-18] 16 (03/16 1409) BP: (108-158)/(76-93) 108/76 (03/16 1409) SpO2:  [93 %-100 %] 99 % (03/16 1409)  Physical Exam: General: Alert, well appearing, NAD CV: RRR, no murmurs, normal S1/S2 Pulm: CTAB, good WOB on RA, no crackles or wheezing Abd: Soft, no distension, no tenderness Ext: No BLE edema   Laboratory: Most recent CBC Lab Results  Component Value Date   WBC 11.6 (H) 01/08/2024   HGB 11.2 (L) 01/08/2024   HCT 34.5 (L) 01/08/2024   MCV 91.5 01/08/2024   PLT 219 01/08/2024   Most recent BMP    Latest Ref Rng & Units 01/08/2024    5:31 AM  BMP  Glucose 70 - 99 mg/dL 85   BUN 8 - 23 mg/dL 20   Creatinine 1.47 - 1.24 mg/dL 8.29   Sodium 562 - 130 mmol/L 140   Potassium 3.5 - 5.1 mmol/L 3.9   Chloride 98 - 111 mmol/L 106   CO2 22 - 32 mmol/L 25   Calcium 8.9 - 10.3 mg/dL 8.4    Imaging/Diagnostic Tests: No new images   Jerre Simon, MD 01/08/2024, 2:40 PM  PGY-3, [place] [facility] FPTS Intern pager: [phone], text pages welcome Secure chat group [facility] Teaching Service

## 2024-01-08 NOTE — Assessment & Plan Note (Addendum)
 Brittle blood sugars despite reduced dose while admitted. -Continue Lantus 15 units daily -SSI, with CBG monitoring -On Carb Modified diet

## 2024-01-08 NOTE — Plan of Care (Signed)

## 2024-01-08 NOTE — Assessment & Plan Note (Addendum)
 Has remained afebrile and still no growth with blood culture.  -Per ID we will continue oral Diflucan for 14 days, last date 3/27. -For follow-up blood culture -Monitor fevers with routine vitals.

## 2024-01-08 NOTE — Assessment & Plan Note (Addendum)
 Hx of Pancreatic cancer s/p Whipple: Continue home Creon 24,000 U BPH: Tamsulosin  GERD: Consider medication if patient develops symptoms History of Port-A-Cath neurosis and SVC occlusion: Continue Xarelto 20 mg daily Vitamin deficiency: Continue B12, held weekly vitamin D (consider restarting if due for dose)

## 2024-01-09 ENCOUNTER — Other Ambulatory Visit (HOSPITAL_COMMUNITY): Payer: Self-pay

## 2024-01-09 DIAGNOSIS — R296 Repeated falls: Secondary | ICD-10-CM | POA: Diagnosis not present

## 2024-01-09 LAB — GLUCOSE, CAPILLARY
Glucose-Capillary: 149 mg/dL — ABNORMAL HIGH (ref 70–99)
Glucose-Capillary: 378 mg/dL — ABNORMAL HIGH (ref 70–99)
Glucose-Capillary: 128 mg/dL — ABNORMAL HIGH (ref 70–99)

## 2024-01-09 MED ORDER — CYANOCOBALAMIN 1000 MCG PO TABS: 1000.0000 ug | ORAL_TABLET | Freq: Every day | ORAL | Status: AC

## 2024-01-09 MED ORDER — INSULIN ASPART 100 UNIT/ML FLEXPEN
PEN_INJECTOR | SUBCUTANEOUS | 11 refills | Status: AC
  Filled 2024-01-09: qty 3, 28d supply, fill #0

## 2024-01-09 MED ORDER — NYSTATIN 100000 UNIT/GM EX POWD
CUTANEOUS | 0 refills | Status: AC | PRN
Start: 2024-01-09 — End: ?
  Filled 2024-01-09: qty 15, 30d supply, fill #0

## 2024-01-09 MED ORDER — TRESIBA FLEXTOUCH 100 UNIT/ML ~~LOC~~ SOPN: 15.0000 [IU] | PEN_INJECTOR | Freq: Every day | SUBCUTANEOUS | Status: AC

## 2024-01-09 MED ORDER — FLUCONAZOLE 200 MG PO TABS
200.0000 mg | ORAL_TABLET | Freq: Every day | ORAL | 0 refills | Status: AC
  Filled 2024-01-09: qty 30, 30d supply, fill #0

## 2024-01-09 NOTE — Assessment & Plan Note (Addendum)
 Fluctuating BPs, but improving. -Continue home carvedilol 12.5 mg twice daily -Restart lisinopril at discharge

## 2024-01-09 NOTE — Assessment & Plan Note (Addendum)
 Has remained afebrile and still no growth with blood culture. -Per ID we will continue oral Diflucan for 14 days, last date 3/27 -Follow-up blood culture -Monitor fevers with routine vitals

## 2024-01-09 NOTE — Inpatient Diabetes Management (Addendum)
 Inpatient Diabetes Program Recommendations  AACE/ADA: New Consensus Statement on Inpatient Glycemic Control (2015)  Target Ranges:  Prepandial:   less than 140 mg/dL      Peak postprandial:   less than 180 mg/dL (1-2 hours)      Critically ill patients:  140 - 180 mg/dL    Latest Reference Range & Units 01/08/24 06:53 01/08/24 11:32 01/08/24 16:11 01/08/24 21:50  Glucose-Capillary 70 - 99 mg/dL 045 (H)    15 units Lantus @0858  215 (H)  3 units Novolog  149 (H)  1 unit Novolog  182 (H)  (H): Data is abnormally high  Latest Reference Range & Units 01/09/24 06:22  Glucose-Capillary 70 - 99 mg/dL 409 (H)  1 unit Novolog   (H): Data is abnormally high    Admit with: Frequent falls suspected to be due to hypoglycemia or dehydration  History: DM, Adenocarcinoma of the pancreas s/p Whipple 2019  Home DM Meds: Tresiba 28 units daily  Current Orders: Lantus 15 units daily      Novolog Sensitive Correction Scale/ SSI (0-9 units) TID AC    Met w/ pt at bedside.  Pt was lying in bed ordering food for lunch when I got there.  I asked pt about his desire to start CGM for glucose monitoring at home--Pt looked confused and was not sure what I was talking about.  I asked him what he knows about CGM and he stated "nothing" and that he would do whatever I wanted him to do.  Pt did tell me he has fingerstick CBG meter at home and feels comfortable checking CBGs.  We looked at his iphone and his iphone is compatible with the FSL3 app, however, pt cannot download the app b/c he can't remember his Password (PW).  Pt told me wife will not know PW either--PW is likely in a book of other PWs at home.  Pt told me if I give him the samples he will ask his 10 year old grandson to come over and help him download the app and start the glucose sensor.  MD did give me permission to go ahead and give the pt 2 free FSL3 sensors to take home--I put the sample sensors and an educational pamphlet in pt's bag for  home.  I asked pt if I could call pt's grandson but he politely told me "No" and said he would talk to his Lucila Maine about it.  I did check pt's competence with the insulin pen.  Pt was able to verbally tell me how to properly use insulin pen at home.  He has been injecting insulin into his upper and outer thighs only.  Has some small bruising on the outer left thigh.  We talked about how it would be better for pt to give himself injections in his abdomen.  Asked pt to use fatty area on abdomen and to rotate like a clock around the abdomen.  Pt was able to tell me his concern about the dose of his insulin and stated to me he thinks he's on too much insulin b/c he had low CBG at home.  Discussed with pt that the MD will likely lower the dose of insulin when he goes home and the RN will review that dose with him at time of d/c.  Pt very appreciative of visit.     --Will follow patient during hospitalization--  Ambrose Finland RN, MSN, CDCES Diabetes Coordinator Inpatient Glycemic Control Team Team Pager: [phone] (8a-5p)

## 2024-01-09 NOTE — Progress Notes (Signed)
 OT Cancellation Note  Patient Details Name: Jonathan Dawson MRN: [id number] DOB: 06-Apr-1941   Cancelled Treatment:    Reason Eval/Treat Not Completed: Other (comment) (OT imminent order received, per discussion with PT, pt improving, moving CGA-supervision level compared to moving at a min A level on OT evaluation. No need to reassess at this time. Will follow up with pt next date as per POC.)  Carver Fila, OTD, OTR/L SecureChat [facility] Acute Re[facility] [phone]   Dalphine Handing 01/09/2024, 1:54 PM

## 2024-01-09 NOTE — Assessment & Plan Note (Addendum)
 Hx of Pancreatic cancer s/p Whipple: Continue home Creon 24,000 U BPH: Tamsulosin daily History of Port-A-Cath neurosis and SVC occlusion: Continue Xarelto 20 mg daily Vitamin deficiency: Continue B12, held weekly vitamin D (consider restarting if due for dose)

## 2024-01-09 NOTE — Assessment & Plan Note (Addendum)
 CBGs primarily in 100s, spike to 378 today.  Reduced from home dose of insulin in setting of hypoglycemia. -Continue Lantus 15 units daily -SSI, with CBG monitoring ACHS -Carb Modified diet -Appreciate diabetes coordinator assistance, CGM samples for home provided

## 2024-01-09 NOTE — Discharge Summary (Signed)
 Family Medicine Teaching Service [facility] Discharge Summary  Patient name: Jonathan Dawson Medical record number: [id number] Date of birth: 1941-04-29 Age: 83 y.o. Gender: male Date of Admission: 01/06/2024  Date of Discharge: 01/09/2024 Admitting Physician: Levin Erp, MD  Primary Care Provider: Wilfred Curtis, MD Consultants: None  Indication for Hospitalization: Recurrent falls  Discharge Diagnoses/Problem List:  Principal Problem for Admission: Falls Other Problems addressed during stay:  Principal Problem:   Frequent falls Active Problems:   History of fungal infection   Type 2 diabetes mellitus with other specified complication (HCC)   Hypertension   CKD (chronic kidney disease)   Chronic health problem  Brief Hospital Course:  Jonathan Dawson is a 83 y.o. 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  Falls Unclear cause, however suspect multifactorial etiology including hypoglycemic episodes, orthostasis, dehydration, and deconditioning.  Had some hypoglycemic readings on decreased insulin during hospitalization.  ACS workup was unremarkable and echo obtained showed EF of 70-75%, mild-moderate aortic regurgitation with calcification, and 4mm ascending aortic aneurysm.  Orthostatic vitals were positive and lisinopril was held.  Worked with PT/OT who recommended Home Health to improve strength and patient was discharged home in stable condition after shared-decision making conversation with patient and spouse, both in agreement with plan.  T2DM A1c 12.0 in February.  Initially presented with hyperglycemia, however had an episode of hypoglycemia after initiation of basal insulin home dose 28 units daily.   Patient not checking CBGs at home.  Suspect possible hypoglycemia related to falls.  CBGs were closely monitored, and patient was discharged home on the following regimen:  Tresiba 15 units and novolog 2 units daily with largest meals if CBG >150.  CGM sample was provided at discharge.  Nausea  Hypokalemia Dehydration 2/2 emesis believed to be contributing to patient's falls and hypokalemia.  Nausea resolved with Zofran as needed and hypokalemia resolved after repletion and resolution of emesis.  Hx of Fungal Infection Urine culture positive for yeast.  Spoke with infectious disease, who felt patient did not successfully complete his Diflucan course prior hospitalization and recommended resuming Diflucan through 3/27.  Other chronic conditions were medically managed with home medications and formulary alternatives as necessary (pancreatic insufficiency s/p Whipple, BPH, Port-A-Cath/SVC occlusion, vitamin deficiencies).  Follow-up recommendations: Please review insulin regimen, titrate or taper as necessary.  Decreased patient to 15 units long acting daily given hypoglycemia, added 2 units novolog with largest meal of day for CBG >150. Patient also provided with CGM at discharge, but unable to set up the app due to not having password to his phone.  Please assist him with setting it up if needed, but also diabetes coordinator information for assistance provided in discharge paperwork. Holding lisinopril given orthostatics at discharge. Consider holding tamsulosin if persistently experiencing symptomatic orthostasis. Recommend repeat Echocardiogram in 1 year for 44 mm ascending aorta aneurysm. Consider bisphosphonate for vertebral fracture. Consider outpatient evaluation for function and imaging follow-up of L adrenal adenoma.  Disposition: HH PT/OT  Discharge Condition: Stable  Discharge Exam:  Vitals:   01/09/24 1016 01/09/24 1342  BP:  117/74  Pulse:  66  Resp: 16   Temp:  97.6 F  (36.4 C)  SpO2:  97%   Physical Exam: General: Deconditioned. Resting in bed in NAD. Alert  and baseline. Eyes: No scleral injections or icterus. ENTM: MMM. Neck: No JVD. Cardiovascular: RRR. Normal S1/S2. No murmurs/rubs/gallops. 2+ radial pulses. Respiratory: Normal WOB on room air. No wheezes/crackles/rhonchi. Gastrointestinal: Normoactive bowel sounds. No TTP. No rebound or guarding. MSK: Normal movement of all extremities. Extremities: No peripheral edema.  Capillary refill 2 seconds. Derm: No rashes grossly. Neuro: Alert and oriented x4.  Significant Procedures: None  Significant Labs and Imaging:  Recent Labs  Lab 01/08/24 0531  WBC 11.6*  HGB 11.2*  HCT 34.5*  PLT 219   Recent Labs  Lab 01/08/24 0531  NA 140  K 3.9  CL 106  CO2 25  GLUCOSE 85  BUN 20  CREATININE 1.29*  CALCIUM 8.4*   - Orthostatic vitals: Positive - CK, lactate, troponin: WNL  Results/Tests Pending at Time of Discharge: - Blood cultures 3/14: NG @ 3 days  Discharge Medications:  Allergies as of 01/09/2024   No Known Allergies      Medication List     PAUSE taking these medications    lisinopril 40 MG tablet Wait to take this until your doctor or other care provider tells you to start again. Commonly known as: ZESTRIL Take 40 mg by mouth daily.       TAKE these medications    acetaminophen 325 MG tablet Commonly known as: TYLENOL Take 325-650 mg by mouth 2 (two) times daily as needed for mild pain.   carvedilol 12.5 MG tablet Commonly known as: COREG Take 12.5 mg by mouth 2 (two) times daily with a meal.   cetirizine 10 MG tablet Commonly known as: ZYRTEC Take 10 mg by mouth daily as needed for allergies.   Creon 24000-76000 units Cpep Generic drug: Pancrelipase (Lip-Prot-Amyl) Take 1 capsule by mouth daily.   cyanocobalamin 1000 MCG tablet Take 1 tablet (1,000 mcg total) by mouth daily.   fluconazole 200 MG tablet Commonly known as: DIFLUCAN Take 1 tablet  (200 mg total) by mouth daily.   fluticasone 50 MCG/ACT nasal spray Commonly known as: FLONASE Place 2 sprays into both nostrils daily. What changed:  when to take this reasons to take this   insulin aspart 100 UNIT/ML FlexPen Commonly known as: NOVOLOG INJECT 2 UNITS INTO THE SKIN IF GLUCOSE IS ABOVE 150 WITH LARGEST MEAL OF DAY   nystatin powder Commonly known as: MYCOSTATIN/NYSTOP Apply topically as needed (for yeast infection in skin fold).   potassium chloride 10 MEQ tablet Commonly known as: KLOR-CON Take 10 mEq by mouth 2 (two) times daily.   tamsulosin 0.4 MG Caps capsule Commonly known as: FLOMAX Take 0.4 mg by mouth daily.   Evaristo Bury FlexTouch 100 UNIT/ML FlexTouch Pen Generic drug: insulin degludec Inject 15 Units into the skin daily. What changed: how much to take   VITAMIN B-2 PO Take 1 tablet by mouth daily.   Vitamin D (Ergocalciferol) 1.25 MG (50000 UNIT) Caps capsule Commonly known as: DRISDOL Take 50,000 Units by mouth once a week.   VITRON-C PO Take 1 tablet by mouth. Sunday Tuesday friday   Xarelto 20 MG Tabs tablet Generic drug: rivaroxaban Take 20 mg by mouth daily.       Discharge Instructions: Please refer to Patient Instructions section of EMR for full details.  Patient was counseled important signs and symptoms that should prompt return to medical care, changes in medications, dietary instructions, activity restrictions, and follow up appointments.   Follow-Up Appointments: Instructed patient to call ASAP and schedule appointment with PCP within 1 week.  Lilou Kneip,  Ekin Pilar, MD 01/09/2024, 4:46 PM PGY-1, [facility]

## 2024-01-09 NOTE — Progress Notes (Signed)
 Physical Therapy Treatment Patient Details Name: Jonathan Dawson MRN: [id number] DOB: [birth year] Today's Date: 01/09/2024   History of Present Illness 83 y/o male admitted via EMS on 01/06/24 d/t recent falls in home suspected due to dehydration and hypoglycemia, wife reports decreased cog. PMH  includes uncontrolled DM, CKD 3A, prostate cancer with lymph node  (follows with oncology, on ADT with Lupron, due in April), pancreatitic cancer, hx of SV syndrome (had port a cath in at that time and was non compliant with AC)--on [place] chronically    PT Comments  Pt making good progress.  He was able to ambulate 150' with RW and close supervision and performed 2 steps with single rail.  Pt needing min cues for safety but otherwise good progression.  Cont POC with recommendation for HHPT.     If plan is discharge home, recommend the following: A little help with walking and/or transfers;Assist for transportation;Help with stairs or ramp for entrance;A little help with bathing/dressing/bathroom   Can travel by private vehicle        Equipment Recommendations  None recommended by PT    Recommendations for Other Services       Precautions / Restrictions Precautions Precautions: Fall     Mobility  Bed Mobility   Bed Mobility: Supine to Sit     Supine to sit: Supervision          Transfers Overall transfer level: Needs assistance Equipment used: Rolling walker (2 wheels) Transfers: Sit to/from Stand Sit to Stand: Supervision           General transfer comment: Cues to keep walker with him until sits    Ambulation/Gait Ambulation/Gait assistance: Contact guard assist, Supervision Gait Distance (Feet): 160 Feet Assistive device: Rolling walker (2 wheels) Gait Pattern/deviations: Step-to pattern, Step-through pattern Gait velocity: decreased but functional     General Gait Details: Pt starting with shuffle step to pattern but once "loosened up"  able to progress to  step through.  Also, progressed to close supervision   Stairs Stairs: Yes Stairs assistance: Contact guard assist Stair Management: One rail Right, Sideways Number of Stairs: 2 General stair comments: performed with CGA for safety   Wheelchair Mobility     Tilt Bed    Modified Rankin (Stroke Patients Only)       Balance Overall balance assessment: Needs assistance Sitting-balance support: Feet supported Sitting balance-Leahy Scale: Good     Standing balance support: Bilateral upper extremity supported, No upper extremity supported Standing balance-Leahy Scale: Fair Standing balance comment: RW to ambulate; could static stand without support                            Communication    Cognition Arousal: Alert Behavior During Therapy: WFL for tasks assessed/performed   PT - Cognitive impairments: No apparent impairments                                Cueing    Exercises      General Comments General comments (skin integrity, edema, etc.): Pt had no c/o lightheadedness, weakness, or dizziness.  Lunch arrived that limited further exercise      Pertinent Vitals/Pain Pain Assessment Pain Assessment: No/denies pain    Home Living                          Prior Function  PT Goals (current goals can now be found in the care plan section) Progress towards PT goals: Progressing toward goals    Frequency    Min 2X/week      PT Plan      Co-evaluation              AM-PAC PT "6 Clicks" Mobility   Outcome Measure  Help needed turning from your back to your side while in a flat bed without using bedrails?: None Help needed moving from lying on your back to sitting on the side of a flat bed without using bedrails?: None Help needed moving to and from a bed to a chair (including a wheelchair)?: A Little Help needed standing up from a chair using your arms (e.g., wheelchair or bedside chair)?: A  Little Help needed to walk in hospital room?: A Little Help needed climbing 3-5 steps with a railing? : A Little 6 Click Score: 20    End of Session Equipment Utilized During Treatment: Gait belt Activity Tolerance: Patient tolerated treatment well Patient left: in chair;with call bell/phone within reach;with chair alarm set Nurse Communication: Mobility status PT Visit Diagnosis: Other abnormalities of gait and mobility (R26.89);History of falling (Z91.81)     Time: 6063-0160 PT Time Calculation (min) (ACUTE ONLY): 16 min  Charges:    $Gait Training: 8-22 mins PT General Charges $$ ACUTE PT VISIT: 1 Visit                     Anise Salvo, PT Acute Rehab Services [facility] [phone]    Rayetta Humphrey 01/09/2024, 1:28 PM

## 2024-01-09 NOTE — Plan of Care (Signed)
 Saw patient at bedside with spouse Toma Arts present.  Discussed etiology of falls, likely multifactorial with hypoglycemia, hypotension/orthostasis, dehydration, deconditioning contributing.  Discussed utility of CGM and importance of setting up with assistance of PCP.  After observing patient ambulating with PT today, spouse is more confident about safety discharge home with HHPT.  Will proceed with discharge.

## 2024-01-09 NOTE — Progress Notes (Signed)
 Daily Progress Note Intern Pager: [phone]  Patient name: Jonathan Dawson Medical record number: [id number] Date of birth: 12-26-1940 Age: 83 y.o. Gender: male  Primary Care Provider: Patient, No Pcp Per Consultants: None Code Status: FULL  Pt Overview and Major Events to Date:  3/14 - Admitted  Assessment and Plan: Jonathan Dawson is a 83 y.o. male with a pertinent PMH of T2DM, HTN, BPH, GERD, CKD stage 3a recent fungemia hospitalization who presented after multiple falls at home suspected 2/2 hypoglycemia or dehydration.  PCP verified by RN.  Will call wife to discuss dispo planning and ensure safe discharge given her concerns about caring for her husband at home independently.  PT evaluated patient once more and still recommending discharge with HHPT.  Patient was able to demonstrate adequate understanding of insulin regiment with diabetes education RN.  Will need to ensure adherence to medications prior to discharge given that hypoglycemia and orthostasis are implicated as possible etiologies for the patient's presentation. Assessment & Plan Frequent falls Multifactorial with orthostasis, possible dehydration, and hypoglycemia. -Fall precautions -Continue PT/OT -Continuous cardiac monitoring -Caution with tamsulosin History of fungal infection Has remained afebrile and still no growth with blood culture. -Per ID we will continue oral Diflucan for 14 days, last date 3/27 -Follow-up blood culture -Monitor fevers with routine vitals Type 2 diabetes mellitus with other specified complication (HCC) CBGs primarily in 100s, spike to 378 today.  Reduced from home dose of insulin in setting of hypoglycemia. -Continue Lantus 15 units daily -SSI, with CBG monitoring ACHS -Carb Modified diet -Appreciate diabetes coordinator assistance, CGM samples for home provided Hypertension Fluctuating BPs, but improving. -Continue home carvedilol 12.5 mg twice daily -Restart  lisinopril at discharge CKD (chronic kidney disease) Creatinine 1.25 (baseline of 1.2) -Continue to monitor -Avoid nephrotoxic agents Chronic health problem Hx of Pancreatic cancer s/p [place]: Continue home Creon 24,000 U BPH: Tamsulosin daily History of Port-A-Cath neurosis and SVC occlusion: Continue Xarelto 20 mg daily Vitamin deficiency: Continue B12, held weekly vitamin D (consider restarting if due for dose)  FEN/GI: Carb modified PPx: Xarelto Dispo: Pending discussion with spouse, likely home with home health.  Subjective:  No acute events overnight.  Patient is eager to go home, but agrees that his wife does not feel confident he is ready for discharge.  Objective: Temp:  [97.6 F (36.4 C)-98.3 F (36.8 C)] 98 F (36.7 C) (03/17 0807) Pulse Rate:  [58-70] 67 (03/17 0807) Resp:  [16] 16 (03/17 0327) BP: (108-139)/(68-77) 135/77 (03/17 0807) SpO2:  [95 %-99 %] 95 % (03/17 0807)  Physical Exam: General: Deconditioned elderly male resting in bed in NAD. Alert and at baseline. Cardiovascular: Regular rate and rhythm. Normal S1/S2. No murmurs, rubs, or gallops appreciated. 2+ radial pulses. Pulmonary: Clear bilaterally to ascultation. No increased WOB, no accessory muscle usage on room air. No wheezes, crackles, or rhonchi. Abdominal: Normoactive bowel sounds, nondistended. No tenderness to deep or light palpation. No rebound or guarding. Skin: No rashes grossly. Extremities: No peripheral edema bilaterally.  Capillary refill <2 seconds.  Laboratory: Most recent CBC Lab Results  Component Value Date   WBC 11.6 (H) 01/08/2024   HGB 11.2 (L) 01/08/2024   HCT 34.5 (L) 01/08/2024   MCV 91.5 01/08/2024   PLT 219 01/08/2024   Most recent BMP    Latest Ref Rng & Units 01/08/2024    5:31 AM  BMP  Glucose 70 - 99 mg/dL 85   BUN 8 - 23 mg/dL 20  Creatinine 0.61 - 1.24 mg/dL 4.16   Sodium 606 - 301 mmol/L 140   Potassium 3.5 - 5.1 mmol/L 3.9   Chloride 98 - 111 mmol/L  106   CO2 22 - 32 mmol/L 25   Calcium 8.9 - 10.3 mg/dL 8.4     Other pertinent labs: -None  New Imaging/Diagnostic Tests: -None  Britaney Espaillat, MD 01/09/2024, 9:43 AM  PGY-1,  Family Medicine FPTS Intern pager: [phone], text pages welcome Secure chat group [facility] Teaching Service

## 2024-01-09 NOTE — Assessment & Plan Note (Signed)
 Creatinine 1.25 (baseline of 1.2) -Continue to monitor -Avoid nephrotoxic agents

## 2024-01-09 NOTE — Progress Notes (Addendum)
 Transition of Care [facility]) - Inpatient Brief Assessment   Patient Details  Name: Jonathan Dawson MRN: [id number] Date of Birth: 20-Sep-1941  [facility] CM/SW Contact:    Ronny Bacon, RN Phone Number: 01/09/2024, 1:25 PM   Clinical Narrative:  Patient from home for fall. Per secure chat with provider, patient with possible SNF placement instead of HH. If patient needs home health, [facility] is following.  1530 Secure message from provider that patient will be discharged home with home health [place] with [facility] made aware.  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

## 2024-01-09 NOTE — Assessment & Plan Note (Addendum)
 Multifactorial with orthostasis, possible dehydration, and hypoglycemia. -Fall precautions -Continue PT/OT -Continuous cardiac monitoring -Caution with tamsulosin

## 2024-01-11 LAB — CULTURE, BLOOD (ROUTINE X 2)
Culture: NO GROWTH
Culture: NO GROWTH
Special Requests: ADEQUATE
Special Requests: ADEQUATE

## 2024-01-25 ENCOUNTER — Emergency Department (HOSPITAL_BASED_OUTPATIENT_CLINIC_OR_DEPARTMENT_OTHER)

## 2024-01-25 ENCOUNTER — Other Ambulatory Visit: Payer: Self-pay

## 2024-01-25 ENCOUNTER — Emergency Department (HOSPITAL_BASED_OUTPATIENT_CLINIC_OR_DEPARTMENT_OTHER)
Admission: EM | Admit: 2024-01-25 | Discharge: 2024-01-25 | Disposition: A | Discharge status: Home / Self Care | Attending: Emergency Medicine | Admitting: Emergency Medicine

## 2024-01-25 ENCOUNTER — Encounter (HOSPITAL_BASED_OUTPATIENT_CLINIC_OR_DEPARTMENT_OTHER): Payer: Self-pay

## 2024-01-25 DIAGNOSIS — M25572 Pain in left ankle and joints of left foot: Secondary | ICD-10-CM | POA: Diagnosis present

## 2024-01-25 DIAGNOSIS — Z794 Long term (current) use of insulin: Secondary | ICD-10-CM | POA: Insufficient documentation

## 2024-01-25 DIAGNOSIS — Z8507 Personal history of malignant neoplasm of pancreas: Secondary | ICD-10-CM | POA: Insufficient documentation

## 2024-01-25 DIAGNOSIS — E1122 Type 2 diabetes mellitus with diabetic chronic kidney disease: Secondary | ICD-10-CM | POA: Insufficient documentation

## 2024-01-25 DIAGNOSIS — N189 Chronic kidney disease, unspecified: Secondary | ICD-10-CM | POA: Diagnosis not present

## 2024-01-25 DIAGNOSIS — I6782 Cerebral ischemia: Secondary | ICD-10-CM | POA: Insufficient documentation

## 2024-01-25 DIAGNOSIS — Z79899 Other long term (current) drug therapy: Secondary | ICD-10-CM | POA: Diagnosis not present

## 2024-01-25 DIAGNOSIS — W208XXA Other cause of strike by thrown, projected or falling object, initial encounter: Secondary | ICD-10-CM | POA: Insufficient documentation

## 2024-01-25 DIAGNOSIS — S0990XA Unspecified injury of head, initial encounter: Secondary | ICD-10-CM | POA: Diagnosis not present

## 2024-01-25 DIAGNOSIS — I129 Hypertensive chronic kidney disease with stage 1 through stage 4 chronic kidney disease, or unspecified chronic kidney disease: Secondary | ICD-10-CM | POA: Insufficient documentation

## 2024-01-25 DIAGNOSIS — S93492A Sprain of other ligament of left ankle, initial encounter: Secondary | ICD-10-CM | POA: Insufficient documentation

## 2024-01-25 DIAGNOSIS — Z7901 Long term (current) use of anticoagulants: Secondary | ICD-10-CM | POA: Insufficient documentation

## 2024-01-25 DIAGNOSIS — W19XXXA Unspecified fall, initial encounter: Secondary | ICD-10-CM

## 2024-01-25 LAB — BASIC METABOLIC PANEL WITH GFR
Anion gap: 10 (ref 5–15)
BUN: 15 mg/dL (ref 8–23)
CO2: 23 mmol/L (ref 22–32)
Calcium: 8.6 mg/dL — ABNORMAL LOW (ref 8.9–10.3)
Chloride: 104 mmol/L (ref 98–111)
Creatinine, Ser: 1.18 mg/dL (ref 0.61–1.24)
GFR, Estimated: >60 mL/min (ref >60)
Glucose, Bld: 187 mg/dL — ABNORMAL HIGH (ref 70–99)
Potassium: 3.3 mmol/L — ABNORMAL LOW (ref 3.5–5.1)
Sodium: 137 mmol/L (ref 135–145)

## 2024-01-25 LAB — CBC
HCT: 37.3 % — ABNORMAL LOW (ref 39.0–52.0)
Hemoglobin: 12.3 g/dL — ABNORMAL LOW (ref 13.0–17.0)
MCH: 29.7 pg (ref 26.0–34.0)
MCHC: 33 g/dL (ref 30.0–36.0)
MCV: 90.1 fL (ref 80.0–100.0)
Platelets: 208 10*3/uL (ref 150–400)
RBC: 4.14 MIL/uL — ABNORMAL LOW (ref 4.22–5.81)
RDW: 14.3 % (ref 11.5–15.5)
WBC: 11.6 10*3/uL — ABNORMAL HIGH (ref 4.0–10.5)
nRBC: 0 % (ref 0.0–0.2)

## 2024-01-25 LAB — TROPONIN I (HIGH SENSITIVITY): Troponin I (High Sensitivity): 7 ng/L (ref <18)

## 2024-01-25 MED ORDER — POTASSIUM CHLORIDE CRYS ER 20 MEQ PO TBCR
40.0000 meq | EXTENDED_RELEASE_TABLET | Freq: Once | ORAL | Status: AC
  Administered 2024-01-25: 40 meq via ORAL
  Filled 2024-01-25: qty 2

## 2024-01-25 NOTE — ED Provider Notes (Signed)
 7:47 PM signout from Groce PA-C at shift change.  Patient made aware of negative head CT and cervical spine CT results.  Patient and wife are anxious to be discharged.  BP (!) 155/77   Pulse 70   Temp 98.3 F (36.8 C) (Oral)   Resp 20   Ht 5\' 8"  (1.727 m)   Wt 68 kg   SpO2 96%   BMI 22.81 kg/m     Renne Crigler, PA-C 01/25/24 1948    Melene Plan, DO 01/25/24 2010

## 2024-01-25 NOTE — ED Triage Notes (Signed)
 In for eval of left ankle pain and swelling sec to fall yesterday. Wife wrapped it last night. Ibuprofen at approx 1330 today.

## 2024-01-25 NOTE — ED Provider Notes (Signed)
 [facility] Provider Note   CSN: [id number] Arrival date & time: 01/25/24  1407     History  Chief Complaint  Patient presents with   Ankle Pain    Jonathan Dawson is a 83 y.o. male with medical history to include pancreatic cancer, frequent falls, type 2 diabetes, hypertension, CKD, chronic health problems, right bundle branch block.  Patient presents to ED for evaluation of syncope, left ankle pain.  Patient reports that yesterday he was at [facility].  States that he had to walk from the front of the store to the back of the store multiple times, reports this is a very large store.  Reports that at 1 point during his shopping he had to sit down due to the fact that he felt generally weak.  States that he was walking to his car at this time, had just gotten to his car and opened his trunk.  States that all of a sudden he became globally weak and fell to his knees and then eventually onto the right side of his body.  He reports hitting his head.  He denies losing consciousness.  He denies any preceding chest pain or shortness of breath.  He is unable to tell me why exactly he became globally weak.  He has a recent admission to the hospital for frequent falls.  He endorses blood thinning medication.  Also complaining of left-sided ankle pain that began hurting after the fall.  Denies injuring his left ankle in any particular way, denies twisting the ankle.  Reports that this all occurred yesterday.  Denies any repeat episodes since yesterday.   Ankle Pain      Home Medications Prior to Admission medications   Medication Sig Start Date End Date Taking? Authorizing Provider  acetaminophen (TYLENOL) 325 MG tablet Take 325-650 mg by mouth 2 (two) times daily as needed for mild pain.    [provider]  carvedilol (COREG) 12.5 MG tablet Take 12.5 mg by mouth 2 (two) times daily with a meal. 11/30/23   [provider]  cetirizine  (ZYRTEC) 10 MG tablet Take 10 mg by mouth daily as needed for allergies.    [provider]  CREON 24000-76000 units CPEP Take 1 capsule by mouth daily.    [provider]  cyanocobalamin 1000 MCG tablet Take 1 tablet (1,000 mcg total) by mouth daily. 01/09/24   Tiffany Kocher, DO  fluconazole (DIFLUCAN) 200 MG tablet Take 1 tablet (200 mg total) by mouth daily. 01/09/24 02/08/24  Tiffany Kocher, DO  fluticasone (FLONASE) 50 MCG/ACT nasal spray Place 2 sprays into both nostrils daily. Patient taking differently: Place 2 sprays into both nostrils daily as needed for allergies. 07/18/14   Hommel, Gregary Signs, DO  insulin aspart (NOVOLOG) 100 UNIT/ML FlexPen INJECT 2 UNITS INTO THE SKIN IF GLUCOSE IS ABOVE 150 WITH LARGEST MEAL OF DAY 01/09/24   Tiffany Kocher, DO  Iron-Vitamin C (VITRON-C PO) Take 1 tablet by mouth. Sunday Tuesday friday    [provider]  lisinopril (ZESTRIL) 40 MG tablet Take 40 mg by mouth daily. 10/13/21   [provider]  nystatin (MYCOSTATIN/NYSTOP) powder Apply topically as needed (for yeast infection in skin fold). 01/09/24   Tiffany Kocher, DO  potassium chloride (KLOR-CON) 10 MEQ tablet Take 10 mEq by mouth 2 (two) times daily. 03/07/19   [provider]  Riboflavin (VITAMIN B-2 PO) Take 1 tablet by mouth daily.    [provider]  tamsulosin (FLOMAX) 0.4 MG CAPS capsule Take 0.4 mg by mouth daily. 05/03/23 05/02/24  [provider]  TRESIBA FLEXTOUCH 100 UNIT/ML FlexTouch Pen Inject 15 Units into the skin daily. 01/09/24   Tiffany Kocher, DO  Vitamin D, Ergocalciferol, (DRISDOL) 1.25 MG (50000 UNIT) CAPS capsule Take 50,000 Units by mouth once a week.    [provider]  XARELTO 20 MG TABS tablet Take 20 mg by mouth daily.    [provider]      Allergies    Patient has no known allergies.    Review of Systems   Review of Systems  Respiratory:  Negative for shortness of breath.   Cardiovascular:   Negative for chest pain.  Musculoskeletal:  Positive for arthralgias.  All other systems reviewed and are negative.   Physical Exam Updated Vital Signs BP (!) 155/77   Pulse 70   Temp 98.8 F (37.1 C)   Resp 20   Ht 5\' 8"  (1.727 m)   Wt 68 kg   SpO2 96%   BMI 22.81 kg/m  Physical Exam Vitals and nursing note reviewed.  Constitutional:      General: He is not in acute distress.    Appearance: He is well-developed.  HENT:     Head: Normocephalic and atraumatic.  Eyes:     Conjunctiva/sclera: Conjunctivae normal.  Cardiovascular:     Rate and Rhythm: Normal rate and regular rhythm.     Heart sounds: No murmur heard. Pulmonary:     Effort: Pulmonary effort is normal. No respiratory distress.     Breath sounds: Normal breath sounds.  Abdominal:     Palpations: Abdomen is soft.     Tenderness: There is no abdominal tenderness.  Musculoskeletal:        General: Swelling present.     Cervical back: Neck supple.     Comments: Reduced range of motion to left ankle secondary to swelling.  There is slight soft tissue swelling present.  No erythema, overlying skin change.  2+ DP pulse.  Skin:    General: Skin is warm and dry.     Capillary Refill: Capillary refill takes less than 2 seconds.  Neurological:     Mental Status: He is alert.  Psychiatric:        Mood and Affect: Mood normal.     ED Results / Procedures / Treatments   Labs (all labs ordered are listed, but only abnormal results are displayed) Labs Reviewed  CBC - Abnormal; Notable for the following components:      Result Value   WBC 11.6 (*)    RBC 4.14 (*)    Hemoglobin 12.3 (*)    HCT 37.3 (*)    All other components within normal limits  BASIC METABOLIC PANEL WITH GFR - Abnormal; Notable for the following components:   Potassium 3.3 (*)    Glucose, Bld 187 (*)    Calcium 8.6 (*)    All other components within normal limits  TROPONIN I (HIGH SENSITIVITY)    EKG None  Radiology DG Ankle Complete  Left Result Date: 01/25/2024 CLINICAL DATA:  Status post fall with left ankle pain and swelling EXAM: LEFT ANKLE COMPLETE - 3 VIEW COMPARISON:  None Available. FINDINGS: There are no findings of acute displaced fracture or dislocation. No joint effusion. Mild diffuse osteopenia. Degenerative changes of the ankle. Ankle mortise is intact. Soft tissue swelling over the lateral malleolus. Vascular calcifications. IMPRESSION: 1. No acute displaced fracture or dislocation. 2. Soft  tissue swelling over the lateral malleolus. Electronically Signed   By: Agustin Cree M.D.   On: 01/25/2024 16:54    Procedures Procedures   Medications Ordered in ED Medications  potassium chloride SA (KLOR-CON M) CR tablet 40 mEq (has no administration in time range)    ED Course/ Medical Decision Making/ A&P  Medical Decision Making Amount and/or Complexity of Data Reviewed Labs: ordered. Radiology: ordered.   83 year old presents ED for evaluation.  Please see HPI for further details.  On examination patient is afebrile and nontachycardic.  His lung sounds are clear bilaterally, he is nonhypoxic.  Abdomen is soft and compressible throughout.  Neurological examinations at baseline without focal neurodeficits.  Patient left ankle has some soft tissue swelling, reduced range of motion secondary to swelling.  No obvious deformity.  2+ DP pulse on the left foot.  Patient story is little bit vague.  He is unsure what made him fall to the ground and become globally weak.  He does have recent admission for frequent falls.  He is unsure if he does have, he takes a blood thinner.  Due to the above-stated circumstances, we will work this patient up with CBC BMP troponin EKG.  Will also collect x-ray imaging of left ankle.  Will also collect CT head, cervical spine.  X-ray imaging of left ankle shows soft tissue swelling over the lateral malleolus but no acute displaced fracture or dislocation.  Patient was placed in a cam boot  for this.  He will follow-up with orthopedics.  CBC without leukocytosis, baseline hemoglobin.  Metabolic panel with potassium 3.3, no other electrolyte derangement.  Potassium repleted with 40 mEq oral potassium.  Troponin 7, denies chest pain, EKG nonischemic.  CT scans pending at this time.  At this time, patient workup unremarkable.  Patient CT scans have not been read so we will sign patient out to Dr. Emmit Alexanders PA-C pending imaging results.  Plan of management discussed.   Final Clinical Impression(s) / ED Diagnoses Final diagnoses:  Sprain of anterior talofibular ligament of left ankle, initial encounter  Fall, initial encounter    Rx / DC Orders ED Discharge Orders     None         Al Decant, PA-C 01/25/24 1847    Melene Plan, DO 01/25/24 1932

## 2024-01-25 NOTE — Discharge Instructions (Addendum)
 It was a pleasure taking part in your care.  As discussed, your cough is reassuring.  X-ray imaging of your left ankle shows swelling but no fracture.  Please follow-up with Dr. Ave Filter, orthopedics.  Please weight-bear as tolerated in cam boot until seen by orthopedics.  You may remove the boot to shower, sleep at night.  Please continue taking all medications as prescribed.  Return to the ED with any new or worsening symptoms.
